# Patient Record
Sex: Female | Born: 1975 | Race: White | Hispanic: No | Marital: Single | Smoking: Current every day smoker
Health system: Southern US, Community
[De-identification: ages and names within clinical notes are randomized; demographics above are authoritative.]

## PROBLEM LIST (undated history)

## (undated) DIAGNOSIS — N83209 Unspecified ovarian cyst, unspecified side: Secondary | ICD-10-CM

## (undated) DIAGNOSIS — D649 Anemia, unspecified: Secondary | ICD-10-CM

## (undated) DIAGNOSIS — N3281 Overactive bladder: Secondary | ICD-10-CM

## (undated) DIAGNOSIS — F32A Depression, unspecified: Secondary | ICD-10-CM

## (undated) DIAGNOSIS — F419 Anxiety disorder, unspecified: Secondary | ICD-10-CM

## (undated) DIAGNOSIS — F319 Bipolar disorder, unspecified: Secondary | ICD-10-CM

## (undated) DIAGNOSIS — F4310 Post-traumatic stress disorder, unspecified: Secondary | ICD-10-CM

## (undated) DIAGNOSIS — F603 Borderline personality disorder: Secondary | ICD-10-CM

## (undated) DIAGNOSIS — Z0289 Encounter for other administrative examinations: Secondary | ICD-10-CM

## (undated) DIAGNOSIS — R42 Dizziness and giddiness: Secondary | ICD-10-CM

## (undated) HISTORY — DX: Anemia, unspecified: D64.9

## (undated) HISTORY — DX: Overactive bladder: N32.81

## (undated) HISTORY — DX: Bipolar disorder, unspecified: F31.9

## (undated) HISTORY — DX: Borderline personality disorder: F60.3

## (undated) HISTORY — DX: Unspecified ovarian cyst, unspecified side: N83.209

## (undated) HISTORY — PX: TUBAL LIGATION: SHX77

## (undated) HISTORY — DX: Post-traumatic stress disorder, unspecified: F43.10

## (undated) HISTORY — DX: Depression, unspecified: F32.A

## (undated) HISTORY — DX: Anxiety disorder, unspecified: F41.9

---
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
## 1997-10-07 ENCOUNTER — Ambulatory Visit (INDEPENDENT_AMBULATORY_CARE_PROVIDER_SITE_OTHER): Admit: 1997-10-07 | Disposition: A | Discharge status: Home / Self Care | Payer: Self-pay | Source: Ambulatory Visit

## 1997-10-19 ENCOUNTER — Ambulatory Visit (INDEPENDENT_AMBULATORY_CARE_PROVIDER_SITE_OTHER): Admit: 1997-10-19 | Disposition: A | Discharge status: Home / Self Care | Payer: Self-pay | Source: Ambulatory Visit

## 1997-11-25 ENCOUNTER — Emergency Department: Admit: 1997-11-25 | Disposition: A | Discharge status: Home / Self Care | Payer: Self-pay | Source: Ambulatory Visit

## 1997-11-26 ENCOUNTER — Emergency Department: Admit: 1997-11-26 | Disposition: A | Discharge status: Home / Self Care | Payer: Self-pay | Source: Ambulatory Visit

## 1997-12-17 ENCOUNTER — Emergency Department: Admit: 1997-12-17 | Disposition: A | Discharge status: Home / Self Care | Payer: Self-pay | Source: Ambulatory Visit

## 1997-12-24 ENCOUNTER — Ambulatory Visit: Admit: 1997-12-24 | Disposition: A | Discharge status: Home / Self Care | Payer: Self-pay | Source: Ambulatory Visit

## 1997-12-26 ENCOUNTER — Ambulatory Visit
Admission: EM | Admit: 1997-12-26 | Disposition: A | Discharge status: Home / Self Care | Payer: Self-pay | Source: Ambulatory Visit

## 1998-02-02 ENCOUNTER — Emergency Department: Admit: 1998-02-02 | Disposition: A | Discharge status: Home / Self Care | Payer: Self-pay | Source: Ambulatory Visit

## 1998-02-15 ENCOUNTER — Ambulatory Visit
Admission: AD | Admit: 1998-02-15 | Disposition: A | Discharge status: Home / Self Care | Payer: Self-pay | Source: Ambulatory Visit

## 1998-02-23 ENCOUNTER — Emergency Department: Admit: 1998-02-23 | Disposition: A | Discharge status: Home / Self Care | Payer: Self-pay | Source: Ambulatory Visit

## 1998-03-04 ENCOUNTER — Ambulatory Visit: Admission: EM | Admit: 1998-03-04 | Disposition: A | Discharge status: Home / Self Care | Payer: Self-pay

## 1998-03-05 ENCOUNTER — Emergency Department
Admission: EM | Admit: 1998-03-05 | Disposition: A | Discharge status: Home / Self Care | Payer: Self-pay | Source: Ambulatory Visit

## 1998-03-06 ENCOUNTER — Emergency Department
Admission: EM | Admit: 1998-03-06 | Disposition: A | Discharge status: Home / Self Care | Payer: Self-pay | Source: Ambulatory Visit

## 1998-03-09 ENCOUNTER — Emergency Department: Admit: 1998-03-09 | Disposition: A | Discharge status: Home / Self Care | Payer: Self-pay | Source: Ambulatory Visit

## 1998-03-10 ENCOUNTER — Emergency Department: Admit: 1998-03-10 | Disposition: A | Discharge status: Home / Self Care | Payer: Self-pay | Source: Ambulatory Visit

## 1998-03-14 ENCOUNTER — Emergency Department: Admit: 1998-03-14 | Disposition: A | Discharge status: Home / Self Care | Payer: Self-pay | Source: Ambulatory Visit

## 1998-03-16 ENCOUNTER — Emergency Department: Admit: 1998-03-16 | Disposition: A | Discharge status: Home / Self Care | Payer: Self-pay | Source: Ambulatory Visit

## 1998-03-17 ENCOUNTER — Emergency Department
Admission: EM | Admit: 1998-03-17 | Disposition: A | Discharge status: Home / Self Care | Payer: Self-pay | Source: Ambulatory Visit

## 1998-03-22 ENCOUNTER — Emergency Department: Admit: 1998-03-22 | Disposition: A | Discharge status: Home / Self Care | Payer: Self-pay | Source: Ambulatory Visit

## 1998-03-23 ENCOUNTER — Ambulatory Visit
Admission: AD | Admit: 1998-03-23 | Discharge status: Against Medical Advice | Payer: Self-pay | Source: Ambulatory Visit

## 1998-04-01 ENCOUNTER — Emergency Department: Admit: 1998-04-01 | Disposition: A | Discharge status: Home / Self Care | Payer: Self-pay | Source: Ambulatory Visit

## 1998-04-05 ENCOUNTER — Emergency Department: Admit: 1998-04-05 | Disposition: A | Discharge status: Home / Self Care | Payer: Self-pay | Source: Ambulatory Visit

## 1998-04-06 ENCOUNTER — Emergency Department: Admit: 1998-04-06 | Disposition: A | Discharge status: Home / Self Care | Payer: Self-pay | Source: Ambulatory Visit

## 1998-04-11 ENCOUNTER — Ambulatory Visit (INDEPENDENT_AMBULATORY_CARE_PROVIDER_SITE_OTHER): Admit: 1998-04-11 | Disposition: A | Discharge status: Home / Self Care | Payer: Self-pay | Source: Ambulatory Visit

## 1998-04-22 ENCOUNTER — Emergency Department: Admit: 1998-04-22 | Disposition: A | Discharge status: Home / Self Care | Payer: Self-pay | Source: Ambulatory Visit

## 1998-04-27 ENCOUNTER — Emergency Department: Admit: 1998-04-27 | Disposition: A | Discharge status: Home / Self Care | Payer: Self-pay | Source: Ambulatory Visit

## 1998-05-02 ENCOUNTER — Emergency Department: Admit: 1998-05-02 | Disposition: A | Discharge status: Home / Self Care | Payer: Self-pay | Source: Ambulatory Visit

## 1998-05-06 ENCOUNTER — Emergency Department: Admit: 1998-05-06 | Disposition: A | Discharge status: Home / Self Care | Payer: Self-pay | Source: Ambulatory Visit

## 1998-05-26 ENCOUNTER — Emergency Department: Admit: 1998-05-26 | Disposition: A | Discharge status: Home / Self Care | Payer: Self-pay | Source: Ambulatory Visit

## 1998-05-28 ENCOUNTER — Emergency Department: Admit: 1998-05-28 | Disposition: A | Discharge status: Home / Self Care | Payer: Self-pay | Source: Ambulatory Visit

## 1998-06-02 ENCOUNTER — Emergency Department: Admit: 1998-06-02 | Disposition: A | Discharge status: Home / Self Care | Payer: Self-pay | Source: Ambulatory Visit

## 1998-06-23 ENCOUNTER — Ambulatory Visit: Admit: 1998-06-23 | Disposition: A | Discharge status: Home / Self Care | Payer: Self-pay | Source: Ambulatory Visit

## 1998-06-26 ENCOUNTER — Ambulatory Visit: Admit: 1998-06-26 | Disposition: A | Discharge status: Home / Self Care | Payer: Self-pay | Source: Ambulatory Visit

## 1998-06-28 ENCOUNTER — Emergency Department: Admit: 1998-06-28 | Disposition: A | Discharge status: Home / Self Care | Payer: Self-pay | Source: Ambulatory Visit

## 1998-06-30 ENCOUNTER — Emergency Department: Admit: 1998-06-30 | Disposition: A | Discharge status: Home / Self Care | Payer: Self-pay | Source: Ambulatory Visit

## 1998-06-30 ENCOUNTER — Ambulatory Visit
Admission: AD | Admit: 1998-06-30 | Disposition: A | Discharge status: Other Acute Inpatient Hospital | Payer: Self-pay | Source: Ambulatory Visit

## 2002-08-01 ENCOUNTER — Emergency Department
Admission: EM | Admit: 2002-08-01 | Disposition: A | Discharge status: Home / Self Care | Payer: Self-pay | Source: Ambulatory Visit

## 2004-04-25 ENCOUNTER — Emergency Department
Admission: EM | Admit: 2004-04-25 | Disposition: A | Discharge status: Home / Self Care | Payer: Self-pay | Source: Ambulatory Visit

## 2004-04-28 ENCOUNTER — Ambulatory Visit
Admission: EM | Admit: 2004-04-28 | Disposition: A | Discharge status: Home / Self Care | Payer: Self-pay | Source: Ambulatory Visit

## 2004-05-14 ENCOUNTER — Emergency Department
Admission: EM | Admit: 2004-05-14 | Disposition: A | Discharge status: Home / Self Care | Payer: Self-pay | Source: Ambulatory Visit

## 2004-05-26 ENCOUNTER — Emergency Department
Admission: EM | Admit: 2004-05-26 | Disposition: A | Discharge status: Home / Self Care | Payer: Self-pay | Source: Ambulatory Visit

## 2004-05-28 ENCOUNTER — Emergency Department
Admission: EM | Admit: 2004-05-28 | Disposition: A | Discharge status: Home / Self Care | Payer: Self-pay | Source: Ambulatory Visit

## 2004-05-31 ENCOUNTER — Emergency Department
Admission: EM | Admit: 2004-05-31 | Disposition: A | Discharge status: Home / Self Care | Payer: Self-pay | Source: Ambulatory Visit

## 2004-06-19 ENCOUNTER — Emergency Department
Admission: EM | Admit: 2004-06-19 | Disposition: A | Discharge status: Home / Self Care | Payer: Self-pay | Source: Ambulatory Visit

## 2004-06-20 ENCOUNTER — Emergency Department
Admission: EM | Admit: 2004-06-20 | Disposition: A | Discharge status: Home / Self Care | Payer: Self-pay | Source: Ambulatory Visit

## 2004-07-30 ENCOUNTER — Emergency Department
Admission: EM | Admit: 2004-07-30 | Disposition: A | Discharge status: Home / Self Care | Payer: Self-pay | Source: Ambulatory Visit

## 2005-05-18 ENCOUNTER — Emergency Department
Admission: EM | Admit: 2005-05-18 | Disposition: A | Discharge status: Home / Self Care | Payer: Self-pay | Source: Ambulatory Visit

## 2005-05-21 ENCOUNTER — Emergency Department
Admission: EM | Admit: 2005-05-21 | Disposition: A | Discharge status: Home / Self Care | Payer: Self-pay | Source: Ambulatory Visit

## 2005-05-27 ENCOUNTER — Emergency Department
Admission: EM | Admit: 2005-05-27 | Disposition: A | Discharge status: Home / Self Care | Payer: Self-pay | Source: Ambulatory Visit

## 2005-05-29 ENCOUNTER — Emergency Department
Admission: EM | Admit: 2005-05-29 | Disposition: A | Discharge status: Home / Self Care | Payer: Self-pay | Source: Ambulatory Visit

## 2005-06-02 ENCOUNTER — Emergency Department
Admission: EM | Admit: 2005-06-02 | Disposition: A | Discharge status: Home / Self Care | Payer: Self-pay | Source: Ambulatory Visit

## 2006-04-09 ENCOUNTER — Emergency Department
Admission: EM | Admit: 2006-04-09 | Disposition: A | Discharge status: Home / Self Care | Payer: Self-pay | Source: Ambulatory Visit

## 2006-04-12 ENCOUNTER — Emergency Department
Admission: EM | Admit: 2006-04-12 | Disposition: A | Discharge status: Home / Self Care | Payer: Self-pay | Source: Ambulatory Visit

## 2006-04-14 ENCOUNTER — Ambulatory Visit
Admission: EM | Admit: 2006-04-14 | Disposition: A | Discharge status: Home / Self Care | Payer: Self-pay | Source: Ambulatory Visit

## 2006-04-21 ENCOUNTER — Ambulatory Visit
Admission: RE | Admit: 2006-04-21 | Disposition: A | Discharge status: Home / Self Care | Payer: Self-pay | Source: Ambulatory Visit

## 2006-10-22 ENCOUNTER — Emergency Department
Admission: EM | Admit: 2006-10-22 | Disposition: A | Discharge status: Home / Self Care | Payer: Self-pay | Source: Ambulatory Visit

## 2013-09-06 ENCOUNTER — Emergency Department
Admission: EM | Admit: 2013-09-06 | Discharge: 2013-09-06 | Disposition: A | Discharge status: Home / Self Care | Payer: Medicaid Other | Attending: Emergency Medicine | Admitting: Emergency Medicine

## 2013-09-06 ENCOUNTER — Emergency Department: Payer: Medicaid Other

## 2013-09-06 DIAGNOSIS — N83209 Unspecified ovarian cyst, unspecified side: Secondary | ICD-10-CM | POA: Insufficient documentation

## 2013-09-06 DIAGNOSIS — F319 Bipolar disorder, unspecified: Secondary | ICD-10-CM | POA: Insufficient documentation

## 2013-09-06 DIAGNOSIS — F411 Generalized anxiety disorder: Secondary | ICD-10-CM | POA: Insufficient documentation

## 2013-09-06 DIAGNOSIS — F4310 Post-traumatic stress disorder, unspecified: Secondary | ICD-10-CM | POA: Insufficient documentation

## 2013-09-06 DIAGNOSIS — F17200 Nicotine dependence, unspecified, uncomplicated: Secondary | ICD-10-CM | POA: Insufficient documentation

## 2013-09-06 DIAGNOSIS — N318 Other neuromuscular dysfunction of bladder: Secondary | ICD-10-CM | POA: Insufficient documentation

## 2013-09-06 DIAGNOSIS — F603 Borderline personality disorder: Secondary | ICD-10-CM | POA: Insufficient documentation

## 2013-09-06 DIAGNOSIS — F3289 Other specified depressive episodes: Secondary | ICD-10-CM | POA: Insufficient documentation

## 2013-09-06 DIAGNOSIS — D649 Anemia, unspecified: Secondary | ICD-10-CM | POA: Insufficient documentation

## 2013-09-06 NOTE — ED Notes (Signed)
Megan Pratt crisis care to attempt to call pts mother, to report back.

## 2013-09-06 NOTE — ED Notes (Signed)
Crisis care at bedside now

## 2013-09-06 NOTE — ED Notes (Signed)
Pt given food and sprite, pt denies other needs. Given cell phone to call pastor.

## 2013-09-06 NOTE — ED Notes (Signed)
md shanabrook at bedside. Pt medical c/o cramping in legs and continued over active bladder. Pt denies other medical complaints at this time. Pt clearly anxious, pt cooperative, but prefers to stand and watch RN type during assessment. Pt reports has a court case involving her kids Monday. No other needs expressed. Call bell within reach

## 2013-09-06 NOTE — ED Provider Notes (Signed)
Physician/Midlevel provider first contact with patient: 09/06/13 1927         History     Chief Complaint   Patient presents with   . Psychiatric Evaluation     Patient is a 37 y.o. female presenting with mental health disorder. The history is provided by the patient.   Mental Health Problem  Primary symptoms comment: patient presents to the emergency department after asking the police to bring her here so that I could establish her physical and mental health.. She recently had an altercation with her mother  regarding her mental health The current episode started today (she notes that her mother was called her "psycho").   The onset of the illness is precipitated by a stressful event. The degree of incapacity that she is experiencing as a consequence of her illness is mild. Additional symptoms of the illness do not include no anhedonia, no insomnia, no hypersomnia, no appetite change, no unexpected weight change, no fatigue, no agitation, no psychomotor retardation, no feelings of worthlessness, no attention impairment, no euphoric mood, no increased goal-directed activity, no flight of ideas, no inflated self-esteem, no decreased need for sleep, not distractible, no poor judgment, no visual change, no headaches, no abdominal pain or no seizures. She does not admit to suicidal ideas. She does not have a plan to commit suicide. She does not contemplate harming herself. She has not already injured self. She does not contemplate injuring another person. She has not already  injured another person. Risk factors that are present for mental illness include a history of mental illness.       Past Medical History   Diagnosis Date   . Bipolar 1 disorder    . Depression    . Anxiety    . Borderline personality disorder    . PTSD (post-traumatic stress disorder)    . Ovarian cyst    . Anemia    . Overactive bladder        Past Surgical History   Procedure Date   . Tubal ligation        History reviewed. No pertinent family  history.    Social  History   Substance Use Topics   . Smoking status: Current Every Day Smoker -- 0.5 packs/day     Types: Cigarettes   . Smokeless tobacco: Never Used   . Alcohol Use: No       .     Allergies   Allergen Reactions   . Penicillins        Current/Home Medications    CALCIUM CARBONATE (TUMS) 500 MG CHEWABLE TABLET    Chew 1 tablet by mouth daily.    HYDROXYZINE (ATARAX) 50 MG TABLET    Take 50 mg by mouth nightly.    OXYBUTYNIN (DITROPAN) 5 MG TABLET    Take 5 mg by mouth 2 (two) times daily. TAKE TWO TABLETS BID    PHENAZOPYRIDINE (PYRIDIUM) 200 MG TABLET    Take 200 mg by mouth 3 (three) times daily as needed.    RISPERIDONE (RISPERDAL) 0.5 MG TABLET    Take 0.5 mg by mouth 2 (two) times daily.        Review of Systems   Constitutional: Negative for fever, appetite change, fatigue and unexpected weight change.   HENT: Negative for congestion, ear pain, rhinorrhea and sore throat.    Eyes: Negative for discharge and redness.   Respiratory: Negative for cough.    Cardiovascular: Negative for chest pain and leg swelling.  Gastrointestinal: Negative for nausea, vomiting, abdominal pain, diarrhea, constipation and blood in stool.   Genitourinary: Negative for dysuria, frequency, hematuria, flank pain, vaginal bleeding, vaginal discharge and menstrual problem.   Musculoskeletal: Negative for arthralgias.   Skin: Negative for rash.   Neurological: Negative for seizures and headaches.   Hematological: Negative for adenopathy. Does not bruise/bleed easily.   Psychiatric/Behavioral: Negative for suicidal ideas and agitation. The patient does not have insomnia.        Physical Exam    BP 141/94  Pulse 97  Temp 98.6 F (37 C)  Resp 18  Ht 1.676 m  Wt 102.7 kg  BMI 36.56 kg/m2  SpO2 98%  LMP 08/28/2013    Physical Exam   Nursing note and vitals reviewed.  Constitutional: She is oriented to person, place, and time. She appears well-developed and well-nourished. No distress.   HENT:   Head: Normocephalic  and atraumatic.   Right Ear: External ear normal.   Left Ear: External ear normal.   Nose: Nose normal.   Mouth/Throat: Oropharynx is clear and moist. No oropharyngeal exudate.   Eyes: Conjunctivae normal and EOM are normal. Pupils are equal, round, and reactive to light. Right eye exhibits no discharge. Left eye exhibits no discharge. Right conjunctiva is not injected. No scleral icterus.   Neck: Normal range of motion. Neck supple. No tracheal deviation present. No mass and no thyromegaly present.   Cardiovascular: Normal rate, regular rhythm, normal heart sounds and intact distal pulses.  Exam reveals no gallop and no friction rub.    No murmur heard.  Pulmonary/Chest: Breath sounds normal. No accessory muscle usage or stridor. No respiratory distress. She has no wheezes. She has no rales.   Abdominal: Soft. Bowel sounds are normal. She exhibits no distension and no mass. There is no hepatosplenomegaly. There is no tenderness. There is no rebound and no guarding.   Musculoskeletal: Normal range of motion. She exhibits no edema and no tenderness.   Neurological: She is alert and oriented to person, place, and time. No cranial nerve deficit. She exhibits normal muscle tone. Coordination normal.   Skin: Skin is warm and dry. No rash noted. She is not diaphoretic. No erythema. No pallor.   Psychiatric: She has a normal mood and affect. Her behavior is normal.       MDM and ED Course     ED Medication Orders     None           MDM  Number of Diagnoses or Management Options  Acute anxiety: new and requires workup  Bipolar disorder: established and improving  Diagnosis management comments: This patient presented with a psychiatric condition.  The patient was evaluated and their psychiatric illness has been determined by the ED MD along with appropriate ancillary mental health services, to be stable for outpatient management.  Differential diagnosis included but was not limited to depression, schizophrenia, bipolar  disorder, psychosis, and suicidal or homicidal risk. The patient currently does not exhibit active suicidal or homicidal ideation and is felt to be at low risk for a suicide attempt.  The patient is not gravely disabled and has the means to obtain food and shelter.  Referrals to outpatient mental health resources have been provided prior to discharge and the patient has also been instructed to return immediately if any acute worsening their mental health occurs.  They have also been counseled to abstain from alcohol or drugs.  Diagnostic impression and plan were discussed with the patient and/or  family.  Results of lab/radiology tests were reviewed and discussed with the patient and/or family. All questions were answered and concerns addressed.        Amount and/or Complexity of Data Reviewed  Decide to obtain previous medical records or to obtain history from someone other than the patient: yes  Discuss the patient with other providers: yes    Risk of Complications, Morbidity, and/or Mortality  Presenting problems: moderate  Diagnostic procedures: minimal  Management options: moderate  General comments: Patient stable in ED.  No signs of homicidal, suicidal intent/ideation.    Patient Progress  Patient progress: improved        Procedures    Clinical Impression & Disposition     Clinical Impression  Final diagnoses:   Bipolar disorder   Acute anxiety        ED Disposition     Discharge [name] discharge to home/self care.    Condition at disposition: Stable             New Prescriptions    No medications on file               Fabian Sharp, MD  09/13/13 [phone]

## 2013-09-06 NOTE — ED Notes (Signed)
Pt awaiting crisis care now

## 2013-09-06 NOTE — ED Notes (Signed)
Pt reports has had children taken from her, possibly for psych reasons, pt reports is in [place] visiting her mother. Got in an argument with her mother tonight. Pt reports her mother "called her psycho" and she "Wants someone to tell them that shes not crazy". "I want to prove my innocence"

## 2013-09-28 ENCOUNTER — Emergency Department
Admission: EM | Admit: 2013-09-28 | Discharge: 2013-09-28 | Disposition: A | Discharge status: Home / Self Care | Payer: Medicaid Other | Attending: Emergency Medicine | Admitting: Emergency Medicine

## 2013-09-28 ENCOUNTER — Emergency Department: Payer: Medicaid Other

## 2013-09-28 DIAGNOSIS — R5383 Other fatigue: Secondary | ICD-10-CM | POA: Insufficient documentation

## 2013-09-28 DIAGNOSIS — F3289 Other specified depressive episodes: Secondary | ICD-10-CM | POA: Insufficient documentation

## 2013-09-28 DIAGNOSIS — R5381 Other malaise: Secondary | ICD-10-CM | POA: Insufficient documentation

## 2013-09-28 DIAGNOSIS — F4320 Adjustment disorder, unspecified: Secondary | ICD-10-CM | POA: Insufficient documentation

## 2013-09-28 DIAGNOSIS — F319 Bipolar disorder, unspecified: Secondary | ICD-10-CM | POA: Insufficient documentation

## 2013-09-28 LAB — VH URINE DRUG SCREEN
Amphetamine: NEGATIVE
Barbiturates: NEGATIVE
Cannabinoids: NEGATIVE
Cocaine: NEGATIVE
Opiates: NEGATIVE
Phencyclidine: NEGATIVE
Urine Benzodiazepines: NEGATIVE
Urine Creatinine Random: 70.96 mg/dL
Urine Methadone Screen: NEGATIVE
Urine Oxycodone: NEGATIVE
Urine Specific Gravity: 1.012 (ref 1.001–1.040)
pH, Urine: 5.4 pH (ref 5.0–8.0)

## 2013-09-28 LAB — BASIC METABOLIC PANEL
Anion Gap: 11.3 mMol/L (ref 7.0–18.0)
BUN / Creatinine Ratio: 9.5 Ratio — ABNORMAL LOW (ref 10.0–30.0)
BUN: 7 mg/dL (ref 7–22)
CO2: 24.6 mMol/L (ref 20.0–30.0)
Calcium: 10.3 mg/dL (ref 8.5–10.5)
Chloride: 107 mMol/L (ref 98–110)
Creatinine: 0.74 mg/dL (ref 0.60–1.20)
EGFR: >60 mL/min/{1.73_m2}
Glucose: 80 mg/dL (ref 70–99)
Osmolality Calc: 274 mOsm/kg — ABNORMAL LOW (ref 275–300)
Potassium: 3.9 mMol/L (ref 3.5–5.3)
Sodium: 139 mMol/L (ref 136–147)

## 2013-09-28 LAB — ETHANOL: Alcohol: <10 mg/dL (ref 0–9)

## 2013-09-28 LAB — CBC AND DIFFERENTIAL
Basophils %: 1 % (ref 0.0–3.0)
Basophils Absolute: 0.1 10*3/uL (ref 0.0–0.3)
Eosinophils %: 0.4 % (ref 0.0–7.0)
Eosinophils Absolute: 0 10*3/uL (ref 0.0–0.8)
Hematocrit: 35.1 % — ABNORMAL LOW (ref 36.0–48.0)
Hemoglobin: 11.1 gm/dL — ABNORMAL LOW (ref 12.0–16.0)
Lymphocytes Absolute: 2.1 10*3/uL (ref 0.6–5.1)
Lymphocytes: 36.9 % (ref 15.0–46.0)
MCH: 24 pg — ABNORMAL LOW (ref 28–35)
MCHC: 32 gm/dL (ref 32–36)
MCV: 78 fL — ABNORMAL LOW (ref 80–100)
MPV: 12.1 fL — ABNORMAL HIGH (ref 6.0–10.0)
Monocytes Absolute: 0.3 10*3/uL (ref 0.1–1.7)
Monocytes: 4.8 % (ref 3.0–15.0)
Neutrophils %: 56.9 % (ref 42.0–78.0)
Neutrophils Absolute: 3.2 10*3/uL (ref 1.7–8.6)
PLT CT: 199 10*3/uL (ref 130–440)
RBC: 4.52 10*6/uL (ref 3.80–5.00)
RDW: 15.8 % — ABNORMAL HIGH (ref 11.0–14.0)
WBC: 5.7 10*3/uL (ref 4.0–11.0)

## 2013-09-28 LAB — SALICYLATE LEVEL: Salicylate Level: <5 mg/dL — ABNORMAL LOW (ref 5.0–30.0)

## 2013-09-28 LAB — TSH: TSH: 1.04 u[IU]/mL (ref 0.40–4.20)

## 2013-09-28 LAB — HCG, SERUM, QUALITATIVE: BHCG Qualitative: NEGATIVE

## 2013-09-28 LAB — ACETAMINOPHEN LEVEL: Acetaminophen Level: <3 ug/mL — ABNORMAL LOW (ref 10.0–30.0)

## 2013-09-28 MED ORDER — NICOTINE 21 MG/24HR TD PT24
1.0000 | MEDICATED_PATCH | Freq: Once | TRANSDERMAL | Status: DC
Start: 2013-09-28 — End: 2013-09-28

## 2013-09-28 NOTE — ED Notes (Signed)
Crises called.

## 2013-09-28 NOTE — ED Notes (Signed)
Straight stick to right wrist first attempt, pt tolerated well.  Urine and blood sent to lab at this time.

## 2013-09-28 NOTE — ED Notes (Signed)
Pt seen for psychiatric assessment.  Pt denies suicidal and homicidal ideations.  Psychosis not evident.  She denies need for hospitalization.  D/C safety plan done and referrals for outpatient treatment given.

## 2013-09-28 NOTE — ED Provider Notes (Addendum)
Physician/Midlevel provider first contact with patient: 09/28/13 2001         History     Chief Complaint   Patient presents with   . Psychiatric Evaluation     HPI Comments: The patient comes in today after her she called 911 wanting to speak with the crisis worker. She says the police came to her house and brought her here.  Patient says that she has been stressed lately due to to her strained relationship with her mother; she says that she recently moved in with her. Her mother apparently does not approve of her smoking habits and the patient says that her mother "torments me"  by what holding cigarettes. The patient has a history of depression; medically, the patient says that she has been anemic "all my life." the patient also says that she has "female problems" involving an irregular menstrual period. The patient denies feeling suicidal or wanting to hurt herself.     The history is provided by the patient.       Past Medical History   Diagnosis Date   . Bipolar 1 disorder    . Depression    . Anxiety    . Borderline personality disorder    . PTSD (post-traumatic stress disorder)    . Ovarian cyst    . Anemia    . Overactive bladder        Past Surgical History   Procedure Date   . Tubal ligation        History reviewed. No pertinent family history.    Social  History   Substance Use Topics   . Smoking status: Current Every Day Smoker -- 0.5 packs/day     Types: Cigarettes   . Smokeless tobacco: Never Used   . Alcohol Use: No       .     Allergies   Allergen Reactions   . Penicillins        Current/Home Medications    CALCIUM CARBONATE (TUMS) 500 MG CHEWABLE TABLET    Chew 1 tablet by mouth daily.    HYDROXYZINE (ATARAX) 50 MG TABLET    Take 50 mg by mouth nightly.    OXYBUTYNIN (DITROPAN) 5 MG TABLET    Take 5 mg by mouth 2 (two) times daily. TAKE TWO TABLETS BID    PHENAZOPYRIDINE (PYRIDIUM) 200 MG TABLET    Take 200 mg by mouth 3 (three) times daily as needed.    RISPERIDONE (RISPERDAL) 0.5 MG TABLET     Take 0.5 mg by mouth 2 (two) times daily.        Review of Systems   Constitutional: Positive for fatigue.        Patient says that she has been more fatigued and irritable than usual   Psychiatric/Behavioral: Positive for dysphoric mood.       Physical Exam    BP 118/75  Pulse 80  Temp 98 F (36.7 C)  Resp 18  Wt 99.791 kg  SpO2 99%  LMP 08/28/2013    Physical Exam   Nursing note and vitals reviewed.  Constitutional: She appears well-developed and well-nourished.        General pallor   HENT:   Head: Normocephalic and atraumatic.   Mouth/Throat: Oropharynx is clear and moist. No oropharyngeal exudate.   Eyes: Conjunctivae normal and EOM are normal. Right eye exhibits no discharge. Left eye exhibits no discharge. No scleral icterus.   Neck: Neck supple. No tracheal deviation present. No thyromegaly present.  Cardiovascular: Normal rate and regular rhythm.  Exam reveals no gallop and no friction rub.    No murmur heard.  Pulmonary/Chest: Effort normal and breath sounds normal. No stridor. No respiratory distress. She has no wheezes. She has no rales. She exhibits no tenderness.   Abdominal: Bowel sounds are normal. She exhibits no distension and no mass. There is no tenderness. There is no rebound and no guarding.   Musculoskeletal: She exhibits no edema and no tenderness.   Lymphadenopathy:     She has no cervical adenopathy.   Neurological: She is alert. She exhibits normal muscle tone. Coordination normal.   Skin: No rash noted. She is not diaphoretic. No erythema. There is pallor.   Psychiatric: She exhibits a depressed mood.        Hyper-religious overtones when patient talks       MDM and ED Course     ED Medication Orders      Start     Status Ordering Provider    09/28/13 2010   nicotine (NICODERM CQ) 21 MG/24HR patch 1 patch   Once in ED      Route: Transdermal  Ordered Dose: 1 patch         Ordered Oseias Horsey C                 MDM  Number of Diagnoses or Management Options  Bipolar disorder:  established and worsening  Diagnosis management comments: CONSULT  09/28/2013 8:11 PM: Placed call to crisis care.  Call returned:  2011  Physician coming to see patient    1. Depression  2. Bipolar disorder  3. Stressful social situation             Procedures    Clinical Impression & Disposition     Clinical Impression  Final diagnoses:   Bipolar disorder        ED Disposition     Discharge [name] discharge to home/self care.    Condition at disposition: Stable             New Prescriptions    No medications on file               Myna Hidalgo, MD  09/28/13 2038    Myna Hidalgo, MD  09/28/13 2123

## 2013-09-28 NOTE — ED Notes (Signed)
Pt states that she wants to talk to crisis worker

## 2013-09-28 NOTE — ED Notes (Signed)
Dr Jennye Boroughs at bedside.

## 2013-09-28 NOTE — ED Notes (Signed)
Crisis at bedside.

## 2013-09-29 ENCOUNTER — Emergency Department: Payer: Medicaid Other

## 2013-09-29 ENCOUNTER — Emergency Department
Admission: EM | Admit: 2013-09-29 | Discharge: 2013-09-29 | Disposition: A | Discharge status: Home / Self Care | Payer: Medicaid Other | Attending: Emergency Medicine | Admitting: Emergency Medicine

## 2013-09-29 DIAGNOSIS — Z5900 Homelessness unspecified: Secondary | ICD-10-CM | POA: Insufficient documentation

## 2013-09-29 DIAGNOSIS — D649 Anemia, unspecified: Secondary | ICD-10-CM | POA: Insufficient documentation

## 2013-09-29 DIAGNOSIS — F411 Generalized anxiety disorder: Secondary | ICD-10-CM | POA: Insufficient documentation

## 2013-09-29 DIAGNOSIS — Z87891 Personal history of nicotine dependence: Secondary | ICD-10-CM | POA: Insufficient documentation

## 2013-09-29 DIAGNOSIS — F319 Bipolar disorder, unspecified: Secondary | ICD-10-CM | POA: Insufficient documentation

## 2013-09-29 MED ORDER — NICOTINE 21 MG/24HR TD PT24
1.0000 | MEDICATED_PATCH | Freq: Once | TRANSDERMAL | Status: DC
Start: 2013-09-29 — End: 2013-09-29

## 2013-09-29 NOTE — ED Provider Notes (Signed)
Physician/Midlevel provider first contact with patient: 09/29/13 1212         [facility] EMERGENCY DEPARTMENT History and Physical Exam      Patient Name: Megan Pratt  Encounter Date:  09/29/2013  Attending Physician: Theodora Blow, MD  PCP: Christa See, MD  Patient DOB:  1976/04/07  MRN:  [id number]  Room:  V40/J81-X      History of Presenting Illness     Chief complaint: Urinary Frequency    HPI/ROS is limited by: mental disability  HPI/ROS given by: patient and and crisis care specialist    Location: n/a  Duration: today  Severity: unknown    Megan Pratt Hock is a 37 y.o. female who presents with a chief complaint of "I have nowhere to live."  The patient was seen in the emergency department by crisis care yesterday, signed a safety contract, and was discharged to home.  She states that when she arrived home last night her parents told her they were "going to have her institutionalized".  She states that this morning her mother told her that she can no longer live at home. The patient states, "I have no where to go.  I need a nice safe place to stay."  She has refused an APS referral in the past, but agrees to it today, after understanding it will help her to find lodging.    Review of Systems     Review of Systems   Constitutional: Negative.    HENT: Negative.    Eyes: Negative.    Respiratory: Negative.    Cardiovascular: Negative.    Gastrointestinal: Negative.    Genitourinary: Negative.    Musculoskeletal: Negative.    Skin: Negative.    Neurological: Negative.    Endo/Heme/Allergies: Negative.    Psychiatric/Behavioral: Negative for suicidal ideas and substance abuse. The patient is nervous/anxious.      Allergies     Pt is allergic to penicillins.    Medications     Current Outpatient Rx   Name  Route  Sig  Dispense  Refill   . CALCIUM CARBONATE ANTACID 500 MG PO CHEW    Oral    Chew 1 tablet by mouth daily.             Marland Kitchen HYDROXYZINE HCL 50 MG PO TABS    Oral    Take 50 mg by mouth nightly.              . OXYBUTYNIN CHLORIDE 5 MG PO TABS    Oral    Take 5 mg by mouth 2 (two) times daily. TAKE TWO TABLETS BID             . PHENAZOPYRIDINE HCL 200 MG PO TABS    Oral    Take 200 mg by mouth 3 (three) times daily as needed.             Marland Kitchen RISPERIDONE 0.5 MG PO TABS    Oral    Take 0.5 mg by mouth 2 (two) times daily.                  Past Medical History     Pt has a past medical history of Bipolar 1 disorder; Depression; Anxiety; Borderline personality disorder; PTSD (post-traumatic stress disorder); Ovarian cyst; Anemia; and Overactive bladder.    Past Surgical History     Pt has past surgical history that includes Tubal ligation.    Family History     The family history is  not on file.    Social History     Pt reports that she has been smoking Cigarettes.  She has been smoking about .5 packs per day. She has never used smokeless tobacco. She reports that she does not drink alcohol or use illicit drugs.    Physical Exam     Blood pressure 132/89, pulse 65, temperature 97.7 F (36.5 C), resp. rate 18, height 1.676 m, weight 100.8 kg, last menstrual period 08/28/2013, SpO2 100.00%.    Physical Exam   Constitutional: She is oriented to person, place, and time. She appears well-developed and well-nourished. No distress.   HENT:   Head: Normocephalic and atraumatic.   Mouth/Throat: Oropharynx is clear and moist.   Eyes: Conjunctivae normal and EOM are normal. Pupils are equal, round, and reactive to light. Right eye exhibits no discharge. Left eye exhibits no discharge. No scleral icterus.   Neck: Normal range of motion. Neck supple. No JVD present.   Cardiovascular: Normal rate, regular rhythm, normal heart sounds and intact distal pulses.    No murmur heard.  Pulmonary/Chest: Effort normal and breath sounds normal.   Abdominal: Soft. Bowel sounds are normal. She exhibits no distension. There is no tenderness.   Musculoskeletal: Normal range of motion. She exhibits no edema and no tenderness.   Lymphadenopathy:     She  has no cervical adenopathy.   Neurological: She is alert and oriented to person, place, and time. No cranial nerve deficit.   Skin: Skin is warm and dry. No rash noted.   Psychiatric: Her behavior is normal. Her mood appears anxious. Cognition and memory are impaired. She expresses no suicidal plans and no homicidal plans.        Religiously preoccupied, not hallucinating.     Orders Placed     No orders of the defined types were placed in this encounter.       Diagnostic Results       The results of the diagnostic studies below have been reviewed by myself:    Labs  Results     ** No Results found for the last 24 hours. **          Radiologic Studies  Radiology Results (24 Hour)     ** No Results found for the last 24 hours. **          EKG: none    ED Course & Treatment     Pt evaluated by [facility] again today and deemed suitable for d/c.  They will make an APS referral.  Pt advocate to help pt find a shelter tonight.     MDM / Critical Care     Blood pressure 132/89, pulse 65, temperature 97.7 F (36.5 C), resp. rate 18, height 1.676 m, weight 100.8 kg, last menstrual period 08/28/2013, SpO2 100.00%.        This chart was generated by an EMR and may contain errors or omissions not intended by the user.    Procedures     None    Diagnosis / Disposition     Clinical Impression  1. Homelessness        Disposition  ED Disposition     Discharge Megan L Suggs discharge to home/self care.    Condition at disposition: Stable            Prescriptions  New Prescriptions    No medications on file  Theodora Blow, MD  10/23/13 [phone]

## 2013-09-29 NOTE — ED Notes (Signed)
Spoke with Florentina Addison on Secretary/administrator for [employer]. States pt will need referred to a Shelter for the weekend and requests to be notified of her disposition for follow up on Monday to see what services she qualifies for.

## 2013-09-29 NOTE — ED Notes (Signed)
Pt was seen here last night has safety plan and denies SI or HI. Pt states she is not safe at parents house and has no where to go. Pt states she is not depressed

## 2013-09-29 NOTE — ED Notes (Signed)
Crisis care and Dr. Roseanna Rainbow in room evaluating pt

## 2017-01-22 ENCOUNTER — Encounter (HOSPITAL_COMMUNITY): Payer: Self-pay

## 2017-01-22 ENCOUNTER — Emergency Department (HOSPITAL_COMMUNITY)
Admission: EM | Admit: 2017-01-22 | Discharge: 2017-01-23 | Disposition: A | Discharge status: Home / Self Care | Payer: Medicaid - Out of State | Attending: Emergency Medicine | Admitting: Emergency Medicine

## 2017-01-22 ENCOUNTER — Emergency Department (HOSPITAL_COMMUNITY): Payer: Medicaid - Out of State

## 2017-01-22 DIAGNOSIS — Y9321 Activity, ice skating: Secondary | ICD-10-CM | POA: Insufficient documentation

## 2017-01-22 DIAGNOSIS — W01118A Fall on same level from slipping, tripping and stumbling with subsequent striking against other sharp object, initial encounter: Secondary | ICD-10-CM | POA: Insufficient documentation

## 2017-01-22 DIAGNOSIS — F17200 Nicotine dependence, unspecified, uncomplicated: Secondary | ICD-10-CM | POA: Diagnosis not present

## 2017-01-22 DIAGNOSIS — Y999 Unspecified external cause status: Secondary | ICD-10-CM | POA: Diagnosis not present

## 2017-01-22 DIAGNOSIS — Y929 Unspecified place or not applicable: Secondary | ICD-10-CM | POA: Diagnosis not present

## 2017-01-22 DIAGNOSIS — S060X0A Concussion without loss of consciousness, initial encounter: Secondary | ICD-10-CM | POA: Diagnosis not present

## 2017-01-22 DIAGNOSIS — S0990XA Unspecified injury of head, initial encounter: Secondary | ICD-10-CM | POA: Diagnosis present

## 2017-01-22 NOTE — ED Triage Notes (Signed)
Pt states ice skating, slipped and fell backwards. Struck head against wall. Pt denies any LOC. Pt with no obvious bleeding at triage. Pt with some expressive aphasia at triage.

## 2017-01-22 NOTE — ED Provider Notes (Signed)
[facility]-EMERGENCY DEPT Provider Note   CSN: [id number] Arrival date & time: 01/22/17  2243  By signing my name below, I, Elder NegusRussell Johnston, attest that this documentation has been prepared under the direction and in the presence of Gilda Creasehristopher J Pollina, MD. Electronically Signed: Elder Negusussell Johnston, Scribe. 01/23/17. 2:07 AM.   History   Chief Complaint Chief Complaint  Patient presents with  . Fall  . Head Injury    HPI Molly Jackson is a 41 y.o. female without any chronic medical problems who presents to the ED for evaluation following a fall. This patient states that she was ice skating approximately 2 hours ago when she slipped and fell backwards. Struck head on impact. At interview, she is amnesic to the exact circumstances following her injury. She is reporting mild posterior head pain and R lower back pain. No neck pain. She is also reporting some lightheadedness.   The history is provided by the patient. No language interpreter was used.  Fall  This is a new problem. The current episode started 1 to 2 hours ago. The problem has not changed since onset.Associated symptoms include headaches. Pertinent negatives include no chest pain and no abdominal pain.    History reviewed. No pertinent past medical history.  There are no active problems to display for this patient.   History reviewed. No pertinent surgical history.  OB History    No data available       Home Medications    Prior to Admission medications   Not on File    Family History History reviewed. No pertinent family history.  Social History Social History  Substance Use Topics  . Smoking status: Current Every Day Smoker  . Smokeless tobacco: Never Used  . Alcohol use No     Allergies   Penicillins   Review of Systems Review of Systems  Cardiovascular: Negative for chest pain.  Gastrointestinal: Negative for abdominal pain.  Musculoskeletal: Positive for back pain. Negative for neck  pain.  Neurological: Positive for light-headedness and headaches. Negative for syncope.  All other systems reviewed and are negative.    Physical Exam Updated Vital Signs BP (!) 139/98   Pulse 91   Temp 98.5 F (36.9 C) (Oral)   Resp 16   SpO2 97%   Physical Exam  Constitutional: She is oriented to person, place, and time. She appears well-developed and well-nourished. No distress.  HENT:  Head: Normocephalic.  Right Ear: Hearing normal.  Left Ear: Hearing normal.  Nose: Nose normal.  Mouth/Throat: Oropharynx is clear and moist and mucous membranes are normal.  There is a small occipital hematoma.   Eyes: Conjunctivae and EOM are normal. Pupils are equal, round, and reactive to light.  Neck: Normal range of motion. Neck supple.  Cardiovascular: Regular rhythm, S1 normal and S2 normal.  Exam reveals no gallop and no friction rub.   No murmur heard. Pulmonary/Chest: Effort normal and breath sounds normal. No respiratory distress. She exhibits no tenderness.  Abdominal: Soft. Normal appearance and bowel sounds are normal. There is no hepatosplenomegaly. There is no tenderness. There is no rebound, no guarding, no tenderness at McBurney's point and negative Murphy's sign. No hernia.  Musculoskeletal: Normal range of motion.  Neurological: She is alert and oriented to person, place, and time. She has normal strength. No cranial nerve deficit or sensory deficit. Coordination normal. GCS eye subscore is 4. GCS verbal subscore is 5. GCS motor subscore is 6.  Skin: Skin is warm, dry and intact. No rash  noted. No cyanosis.  Psychiatric: She has a normal mood and affect. Her speech is normal and behavior is normal. Thought content normal.  Nursing note and vitals reviewed.    ED Treatments / Results  Labs (all labs ordered are listed, but only abnormal results are displayed) Labs Reviewed - No data to display  EKG  EKG Interpretation None       Radiology Ct Head Wo  Contrast  Result Date: 01/23/2017 CLINICAL DATA:  Fall with posterior head pain. EXAM: CT HEAD WITHOUT CONTRAST TECHNIQUE: Contiguous axial images were obtained from the base of the skull through the vertex without intravenous contrast. COMPARISON:  None. FINDINGS: Brain: There is shunt tubing that travels through the anterior extra-axial space VA bifrontal approach. There are extra-axial calcifications along the right frontal convexity. There is no mass, intraparenchymal hemorrhage or extra-axial collection. The ventricles are mildly enlarged for age. No focal parenchymal abnormality. Vascular: No hyperdense vessel or unexpected calcification. Skull: No skull fracture Sinuses/Orbits: The visualized portions of the paranasal sinuses and mastoid air cells are free of fluid. No advanced mucosal thickening. The visualized orbits are normal. Other: None IMPRESSION: 1. No acute intracranial abnormality. 2. Shunt tubing travels through the anterior extra-axial space. Mildly enlarged ventricles for age, possibly due to underlying atrophy. Electronically Signed   By: Deatra Robinson M.D.   On: 01/23/2017 01:16    Procedures Procedures (including critical care time)  Medications Ordered in ED Medications - No data to display   Initial Impression / Assessment and Plan / ED Course  I have reviewed the triage vital signs and the nursing notes.  Pertinent labs & imaging results that were available during my care of the patient were reviewed by me and considered in my medical decision making (see chart for details).     Patient presents to the emergency department for evaluation of head injury. Patient reports that she slipped while skating and fell backwards, hitting the back of her head against a wall. There was no loss of consciousness, but patient was initially confused. She had no focal findings neurologically on examination. 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 CT scan performed. No evidence of injury. No significant hydrocephalus to suggest shunt failure. Patient does well here in the ER, she is awake, alert and oriented. No further confusion, sensorium has now cleared. Symptoms consistent with mild concussion without loss of consciousness. She is appropriate for discharge, given concussion precautions.  Final Clinical Impressions(s) / ED Diagnoses   Final diagnoses:  Concussion without loss of consciousness, initial encounter    New Prescriptions New Prescriptions   No medications on file  I personally performed the services described in this documentation, which was scribed in my presence. The recorded information has been reviewed and is accurate.    Gilda Crease, MD 01/23/17 [phone]

## 2017-01-23 NOTE — ED Notes (Signed)
The pt is c/o just feeling well  No pain

## 2017-01-23 NOTE — ED Notes (Signed)
Pt ambulated to bathroom x1 person assist. Pt slightly unsteady at first but progressively gains her balance the further she walks.

## 2017-01-23 NOTE — Discharge Instructions (Signed)
Schedule follow-up with one of the listed neurology groups if you have any persistent dizziness, memory loss, headaches.

## 2018-05-20 ENCOUNTER — Emergency Department
Admission: EM | Admit: 2018-05-20 | Discharge: 2018-05-21 | Discharge status: Against Medical Advice | Payer: 59 | Attending: Emergency Medicine | Admitting: Emergency Medicine

## 2018-05-20 DIAGNOSIS — Z5321 Procedure and treatment not carried out due to patient leaving prior to being seen by health care provider: Secondary | ICD-10-CM | POA: Insufficient documentation

## 2018-05-20 DIAGNOSIS — N939 Abnormal uterine and vaginal bleeding, unspecified: Secondary | ICD-10-CM | POA: Insufficient documentation

## 2018-05-21 LAB — CBC AND DIFFERENTIAL
Basophils %: 1.4 % (ref 0.0–3.0)
Basophils Absolute: 0.1 10*3/uL (ref 0.0–0.3)
Eosinophils %: 1.7 % (ref 0.0–7.0)
Eosinophils Absolute: 0.1 10*3/uL (ref 0.0–0.8)
Hematocrit: 42 % (ref 36.0–48.0)
Hemoglobin: 14.6 gm/dL (ref 12.0–16.0)
Lymphocytes Absolute: 2.5 10*3/uL (ref 0.6–5.1)
Lymphocytes: 48.6 % — ABNORMAL HIGH (ref 15.0–46.0)
MCH: 35 pg (ref 28–35)
MCHC: 35 gm/dL (ref 32–36)
MCV: 100 fL (ref 80–100)
MPV: 10.6 fL — ABNORMAL HIGH (ref 6.0–10.0)
Monocytes Absolute: 0.3 10*3/uL (ref 0.1–1.7)
Monocytes: 5.8 % (ref 3.0–15.0)
Neutrophils %: 42.5 % (ref 42.0–78.0)
Neutrophils Absolute: 2.2 10*3/uL (ref 1.7–8.6)
PLT CT: 155 10*3/uL (ref 130–440)
RBC: 4.21 10*6/uL (ref 3.80–5.00)
RDW: 11.6 % (ref 11.0–14.0)
WBC: 5.1 10*3/uL (ref 4.0–11.0)

## 2018-05-21 LAB — BASIC METABOLIC PANEL
Anion Gap: 12.4 mMol/L (ref 7.0–18.0)
BUN / Creatinine Ratio: 9.1 Ratio — ABNORMAL LOW (ref 10.0–30.0)
BUN: 7 mg/dL (ref 7–22)
CO2: 22.2 mMol/L (ref 20.0–30.0)
Calcium: 10.1 mg/dL (ref 8.5–10.5)
Chloride: 107 mMol/L (ref 98–110)
Creatinine: 0.77 mg/dL (ref 0.60–1.20)
EGFR: 98 mL/min/{1.73_m2} (ref 60–150)
Glucose: 76 mg/dL (ref 71–99)
Osmolality Calc: 272 mOsm/kg — ABNORMAL LOW (ref 275–300)
Potassium: 3.6 mMol/L (ref 3.5–5.3)
Sodium: 138 mMol/L (ref 136–147)

## 2018-05-21 NOTE — ED Notes (Signed)
Pt stating she would like to leave. PIV removed.

## 2018-05-31 ENCOUNTER — Emergency Department
Admission: EM | Admit: 2018-05-31 | Discharge: 2018-06-01 | Disposition: A | Discharge status: Home / Self Care | Payer: 59 | Attending: Emergency Medicine | Admitting: Emergency Medicine

## 2018-05-31 DIAGNOSIS — N83202 Unspecified ovarian cyst, left side: Secondary | ICD-10-CM | POA: Insufficient documentation

## 2018-05-31 DIAGNOSIS — N938 Other specified abnormal uterine and vaginal bleeding: Secondary | ICD-10-CM | POA: Insufficient documentation

## 2018-05-31 DIAGNOSIS — F419 Anxiety disorder, unspecified: Secondary | ICD-10-CM | POA: Insufficient documentation

## 2018-05-31 DIAGNOSIS — R112 Nausea with vomiting, unspecified: Secondary | ICD-10-CM | POA: Insufficient documentation

## 2018-05-31 LAB — CBC AND DIFFERENTIAL
Basophils %: 1.2 % (ref 0.0–3.0)
Basophils Absolute: 0.1 10*3/uL (ref 0.0–0.3)
Eosinophils %: 2.3 % (ref 0.0–7.0)
Eosinophils Absolute: 0.1 10*3/uL (ref 0.0–0.8)
Hematocrit: 43.1 % (ref 36.0–48.0)
Hemoglobin: 14.7 gm/dL (ref 12.0–16.0)
Lymphocytes Absolute: 2.2 10*3/uL (ref 0.6–5.1)
Lymphocytes: 44.6 % (ref 15.0–46.0)
MCH: 34 pg (ref 28–35)
MCHC: 34 gm/dL (ref 32–36)
MCV: 101 fL — ABNORMAL HIGH (ref 80–100)
MPV: 9.1 fL (ref 6.0–10.0)
Monocytes Absolute: 0.3 10*3/uL (ref 0.1–1.7)
Monocytes: 6.8 % (ref 3.0–15.0)
Neutrophils %: 45 % (ref 42.0–78.0)
Neutrophils Absolute: 2.3 10*3/uL (ref 1.7–8.6)
PLT CT: 142 10*3/uL (ref 130–440)
RBC: 4.28 10*6/uL (ref 3.80–5.00)
RDW: 11.2 % (ref 11.0–14.0)
WBC: 5 10*3/uL (ref 4.0–11.0)

## 2018-05-31 LAB — HCG, SERUM, QUALITATIVE: BHCG Qualitative: NEGATIVE

## 2018-05-31 MED ORDER — SODIUM CHLORIDE 0.9 % IV BOLUS
1000.0000 mL | Freq: Once | INTRAVENOUS | Status: DC
Start: 2018-05-31 — End: 2018-06-01

## 2018-05-31 NOTE — EDIE (Signed)
COLLECTIVE?NOTIFICATION?05/31/2018 21:17?Megan Pratt, Megan Pratt?MRN: [id number]    Criteria Met      High Utilization (6+ED/6 Months)    Security and Safety  No recent Security Events currently on file    ED Care Guidelines  There are currently no ED Care Guidelines for this patient. Please check your facility's medical records system.      Prescription Monitoring Program  PDMP query found no report.  Narx Score not available at this time.      E.D. Visit Count (12 mo.)  Facility Visits   [facility] 4   [facility] 2   Total 6   Note: Visits indicate total known visits.      Recent Emergency Department Visit Summary  Date Facility [facility] Type Diagnoses or Chief Complaint   May 31, 2018 [facility] [place] [facility]      Vaginal Bleeding      May 20, 2018 [facility] [place] [place] [facility]      Vaginal Bleeding      Apr 12, 2018 [facility] [place] [facility]  Chief Complaint: Diarrhea (female), adult    Apr 06, 2018 [facility] [place] [facility]      0. Melena      1. Urinary tract infection, site not specified      2. Unspecified abdominal pain      3. Headache      4. Major depressive disorder, single episode, unspecified      5. Obesity, unspecified      6. Personal history of traumatic brain injury      7. Epilepsy, unspecified, not intractable, without status epilepticus      8. Gastro-esophageal reflux disease without esophagitis      9. Allergy status to other drugs, medicaments and biological substances status      Mar 09, 2018 [facility] [place] [facility]      0. Delusional disorders      2. Gastro-esophageal reflux disease without esophagitis      3. Overactive bladder      4. Personal history of traumatic brain injury      5. Allergy status to other drugs, medicaments and biological substances status      6. Allergy status to  penicillin      7. Other long term (current) drug therapy      8. Long term (current) use of hormonal contraceptives      9. Nicotine dependence, cigarettes, uncomplicated      Feb 24, 2018 [facility] [place] [facility]      0. Rash and other nonspecific skin eruption      1. Other specified dermatitis      2. Cough      3. Epilepsy, unspecified, not intractable, without status epilepticus      4. Gastro-esophageal reflux disease without esophagitis      5. Polyneuropathy, unspecified      6. Personal history of other diseases of the circulatory system      7. Allergy status to other drugs, medicaments and biological substances status      8. Allergy status to penicillin      9. Long term (current) use of hormonal contraceptives          Recent Inpatient Visit Summary  Date Facility [facility] Type Diagnoses or Chief Complaint  Apr 12, 2018 [facility] [place] [facility]      0. Urinary tract infection, site not specified      2. Dehydration      3. Rash and other nonspecific skin eruption      4. Hypokalemia      5. Schizophrenia, unspecified      6. Diarrhea, unspecified      7. Patient's other noncompliance with medication regimen      8. Allergy status to analgesic agent status      9. Allergy status to anesthetic agent status      10. Allergy status to penicillin          Care Team  Provider [facility] Type Phone Fax Service Dates   [facility] [phone]  Current      Collective Portal  This patient has registered at the [facility] Emergency Department   For more information visit: [email or web address]     PLEASE NOTE:    1.   Any care recommendations and other clinical information are provided as guidelines or for historical purposes only, and providers should exercise their own clinical judgment when providing care.    2.   You may only use this  information for purposes of treatment, payment or health care operations activities, and subject to the limitations of applicable Collective Policies.    3.   You should consult directly with the organization that provided a care guideline or other clinical history with any questions about additional information or accuracy or completeness of information provided.    ? 2019 [place], [employer] - [email or web address]

## 2018-05-31 NOTE — ED Notes (Signed)
Pt jerked during IV placement, infiltrated, and unsuccessful. RN made aware.

## 2018-05-31 NOTE — ED Triage Notes (Signed)
Patient presents for c/o vaginal bleeding with lower abdominal pain x2 weeks. States she is starting to feel weak and dizzy at times. Notes that she was on the depo shot but moved and has not gotten a new doctor yet so she is 1 month overdue.

## 2018-06-01 ENCOUNTER — Emergency Department: Payer: 59

## 2018-06-01 LAB — VH URINALYSIS WITH MICROSCOPIC AND CULTURE IF INDICATED
Bilirubin, UA: NEGATIVE
Glucose, UA: NEGATIVE mg/dL
Ketones UA: NEGATIVE mg/dL
Leukocyte Esterase, UA: 25 Leu/uL — AB
Nitrite, UA: NEGATIVE
Protein, UR: NEGATIVE mg/dL
RBC, UA: 3 /hpf (ref 0–5)
Squam Epithel, UA: 7 /hpf — ABNORMAL HIGH (ref 0–2)
Urine Specific Gravity: 1.012 (ref 1.001–1.040)
Urobilinogen, UA: NORMAL mg/dL
WBC, UA: 5 /hpf — ABNORMAL HIGH (ref 0–4)
pH, Urine: 6 pH (ref 5.0–8.0)

## 2018-06-01 LAB — BASIC METABOLIC PANEL
Anion Gap: 10.1 mMol/L (ref 7.0–18.0)
BUN / Creatinine Ratio: 10.8 Ratio (ref 10.0–30.0)
BUN: 8 mg/dL (ref 7–22)
CO2: 25.5 mMol/L (ref 20.0–30.0)
Calcium: 10.1 mg/dL (ref 8.5–10.5)
Chloride: 107 mMol/L (ref 98–110)
Creatinine: 0.74 mg/dL (ref 0.60–1.20)
EGFR: 102 mL/min/{1.73_m2} (ref 60–150)
Glucose: 79 mg/dL (ref 71–99)
Osmolality Calc: 275 mOsm/kg (ref 275–300)
Potassium: 3.6 mMol/L (ref 3.5–5.3)
Sodium: 139 mMol/L (ref 136–147)

## 2018-06-01 MED ORDER — KETOROLAC TROMETHAMINE 10 MG PO TABS
10.0000 mg | ORAL_TABLET | Freq: Three times a day (TID) | ORAL | 0 refills | Status: AC | PRN
Start: 2018-06-01 — End: ?

## 2018-06-01 NOTE — ED Provider Notes (Signed)
[facility] EMERGENCY DEPARTMENT History and Physical Exam      Patient Name: Megan Pratt, Megan Pratt  Encounter Date:  05/31/2018  Attending Physician: Joesphine Bare, MD  PCP: Marisa Sprinkles, MD  Patient DOB:  01/23/1976  MRN:  [id number]  Room:  S29/S29-A      History of Presenting Illness     Chief complaint: Vaginal Bleeding    HPI/ROS is limited by: none  HPI/ROS given by: patient    Location: Vaginal  Duration: 2 to 3 weeks  Severity: mild    Megan Pratt is a 42 y.o. female who presents with 2 to 3 weeks of vaginal bleeding.  The patient does not believe she is pregnant.  She notes that she does take the Depo-Provera shot.  She apparently missed a shot.  The patient she began having heavy bleeding.  He has c continued.  There is been no history of any blackouts.  She notes mild cough.  This is on review of symptoms.  She denies any history of any near syncopal symptoms other than some mild lightheadedness.  She denies any history of any epigastric pain.  She noted left upper quadrant pain to the nurse at triage but denies this to myself.  She has had no history of any other arthralgias or myalgias.  No unintended weight loss or night sweats.  She recently moved from [place] to [facility].  She is not sought any other medical attention for these current symptoms.  There is been no history of any dysphagia.  No signs or symptoms of airway compromise no tongue swelling no lip swelling.  She notes she had some postnasal drip with some scratchiness to her throat on review of system.  There is been no unintended weight loss.  There is been no diarrhea.  She denies any melena no hematochezia.  No current dysuria no hematuria.    Review of Systems   Review of Systems   Constitutional: Negative.  Negative for chills and fever.   HENT: Positive for congestion and sore throat.    Eyes: Negative.    Respiratory: Negative.  Negative for cough, sputum production and shortness of breath.    Cardiovascular:  Negative.  Negative for chest pain, palpitations, orthopnea, claudication and leg swelling.   Gastrointestinal: Positive for nausea and vomiting. Negative for abdominal pain, blood in stool, constipation and diarrhea.   Genitourinary: Negative.  Negative for dysuria, flank pain, frequency, hematuria and urgency.   Musculoskeletal: Negative.  Negative for back pain, falls, joint pain, myalgias and neck pain.   Skin: Negative.  Negative for rash.   Neurological: Negative for dizziness, sensory change, focal weakness, loss of consciousness and headaches.   Endo/Heme/Allergies: Negative.    Psychiatric/Behavioral: Negative.    All other systems reviewed and are negative.          Allergies     Pt is allergic to penicillins.    Medications       Current Facility-Administered Medications:   .  sodium chloride 0.9 % bolus 1,000 mL, 1,000 mL, Intravenous, Once in ED, Joesphine Bare, MD    Current Outpatient Prescriptions:   .  medroxyPROGESTERone Acetate (DEPO-PROVERA IM), Inject into the muscle, Disp: , Rfl:    Medications were reviewed by md  Past Medical History     Pt  Past Medical History:   Diagnosis Date   . Anemia    . Anxiety    . Bipolar 1 disorder    .  Borderline personality disorder    . Depression    . Ovarian cyst    . Overactive bladder    . PTSD (post-traumatic stress disorder)      Past medical history was reviewed by md  Past Surgical History     Pt  Past Surgical History:   Procedure Laterality Date   . TUBAL LIGATION       Past surgical history was reviewed by md  Family History   History reviewed. No pertinent family history.  The family history was reviewed by md  Social History     Pt  Social History     Social History   . Marital status: Married     Spouse name: N/A   . Number of children: N/A   . Years of education: N/A     Social History Main Topics   . Smoking status: Current Every Day Smoker     Packs/day: 0.50     Types: Cigarettes   . Smokeless tobacco: Never Used   . Alcohol use No   .  Drug use: No   . Sexual activity: Not Asked     Other Topics Concern   . None     Social History Narrative   . None     Social history reviewed by md  Physical Exam     Blood pressure (!) 131/96, pulse 83, temperature 98.2 F (36.8 C), temperature source Oral, resp. rate 17, height 1.676 m, weight 94.8 kg, last menstrual period 05/17/2018, SpO2 99 %.    Physical Exam   Constitutional: She is oriented to person, place, and time and well-developed, well-nourished, and in no distress. No distress.   Regular rhythm without murmur rubs or gallops. No bruits. Good peripheral pulses. Good cap refill. Symmetrical pulses. No abdominal bruits     HENT:   Head: Normocephalic and atraumatic.   Right Ear: External ear normal.   Left Ear: External ear normal.   Nose: Nose normal.   Mouth/Throat: Oropharynx is clear and moist.   Eyes: Pupils are equal, round, and reactive to light. Conjunctivae and EOM are normal.   Neck: Normal range of motion. Neck supple. No JVD present.   Cardiovascular: Normal rate, regular rhythm, normal heart sounds and intact distal pulses.  Exam reveals no gallop and no friction rub.    No murmur heard.  Regular rhythm without murmur rubs or gallops. No bruits. Good peripheral pulses. Good cap refill. Symmetrical pulses. No abdominal bruits     Pulmonary/Chest: Effort normal and breath sounds normal. No stridor. No respiratory distress. She has no wheezes. She has no rales.   Abdominal: Soft. Bowel sounds are normal. She exhibits no distension and no mass. There is no tenderness. There is no rebound and no guarding.   Completely benign abdomen   Genitourinary:   Genitourinary Comments: Pubic masses no CVA tenderness.  Pelvic declined by patient   Musculoskeletal: Normal range of motion. She exhibits no edema or tenderness.   Neurological: She is alert and oriented to person, place, and time. No cranial nerve deficit. She exhibits normal muscle tone. Gait normal. Coordination normal. GCS score is 15.    Skin: Skin is warm and dry. No rash noted. She is not diaphoretic. No erythema. No pallor.   Psychiatric: Mood, memory, affect and judgment normal.   Nursing note and vitals reviewed.          Orders Placed     Orders Placed This Encounter   Procedures   .  Urine Culture   . US Pelvis Complete (NON OB)   . CBC and differential   . Beta HCG, Qual, Serum   . Urinalysis w Microscopic and Culture if Indicated   . Basic Metabolic Panel   . Saline lock IV       Diagnostic Results       The results of the diagnostic studies below have been reviewed by myself:    Labs  Results     Procedure Component Value Units Date/Time    Basic Metabolic Panel [[id number]] Collected:  06/01/18 0016    Specimen:  Plasma Updated:  06/01/18 0057     Sodium 139 mMol/L      Potassium 3.6 mMol/L      Chloride 107 mMol/L      CO2 25.5 mMol/L      Calcium 10.1 mg/dL      Glucose 79 mg/dL      Creatinine 4.09 mg/dL      BUN 8 mg/dL      Anion Gap 81.1 mMol/L      BUN/Creatinine Ratio 10.8 Ratio      EGFR 102 mL/min/1.74m2      Osmolality Calc 275 mOsm/kg     Narrative:       This order is a replacement of the rejected order with accession number [id number].    Urinalysis w Microscopic and Culture if Indicated [[id number]]  (Abnormal) Collected:  05/31/18 2335    Specimen:  Urine, Random Updated:  06/01/18 0003     Color, UA Yellow     Clarity, UA Clear     Specific Gravity, UR 1.012     pH, Urine 6.0 pH      Protein, UR Negative mg/dL      Glucose, UA Negative mg/dL      Ketones UA Negative mg/dL      Bilirubin, UA Negative     Blood, UA Large (A)     Nitrite, UA Negative     Urobilinogen, UA Normal mg/dL      Leukocyte Esterase, UA 25 (A) Leu/uL      UR Micro Performed     WBC, UA 5 (H) /hpf      RBC, UA 3 /hpf      Squam Epithel, UA 7 (H) /hpf     Narrative:       A Urine Culture has been ordered based upon the Positive UA results.    Beta HCG, Drema [place], Serum [[id number]] Collected:  05/31/18 2318    Specimen:  Plasma Updated:  05/31/18 2352      BHCG Qual Negative    CBC and differential [[id number]]  (Abnormal) Collected:  05/31/18 2318    Specimen:  Blood from Blood Updated:  05/31/18 2341     WBC 5.0 K/cmm      RBC 4.28 M/cmm      Hemoglobin 14.7 gm/dL      Hematocrit 25.3 %      MCV 101 (H) fL      MCH 34 pg      MCHC 34 gm/dL      RDW 66.4 %      PLT CT 142 K/cmm      MPV 9.1 fL      NEUTROPHIL % 45.0 %      Lymphocytes 44.6 %      Monocytes 6.8 %      Eosinophils % 2.3 %      Basophils % 1.2 %  Neutrophils Absolute 2.3 K/cmm      Lymphocytes Absolute 2.2 K/cmm      Monocytes Absolute 0.3 K/cmm      Eosinophils Absolute 0.1 K/cmm      BASO Absolute 0.1 K/cmm           Radiologic Studies  Radiology Results (24 Hour)     Procedure Component Value Units Date/Time    US Pelvis Complete (NON OB) [[id number]] Collected:  06/01/18 0131    Order Status:  Completed Updated:  06/01/18 0134    Narrative:       CLINICAL HISTORY:  vaginal bleeding. Last menstrual cycle was on 05/14/2018      EXAMINATION:  US PELVIS COMPLETE    COMPARISON:  None    TECHNIQUE:  Transabdominal pelvic ultrasound. Patient declined transvaginal pelvic ultrasound.    FINDINGS:     Uterus has normal echotexture, contour, and size measuring about 8.2 x 4.8 x 4.8 cm.    Endometrial echocomplex is normal echotexture and thickness measuring about 9 mm.    Right ovary is not seen    Left ovary has normal echo fissure, contour, color Doppler flow and size measuring about 3.1 x 2.3 x 2.1 cm containing a 2.2 cm cyst.    No significant free pelvic fluid.      Impression:       2.2 cm left ovarian cyst.    ReadingStation:[id number]          EKG: none      MDM / Critical Care     Blood pressure (!) 131/96, pulse 83, temperature 98.2 F (36.8 C), temperature source Oral, resp. rate 17, height 1.676 m, weight 94.8 kg, last menstrual period 05/17/2018, SpO2 99 %.    This patient presented to the Emergency Department with pelvic pain.   Based on the patients presentation and evaluation there are no  signs of life-threatening, surgical or serious etiology.It is necessary to consider threatened abortion, ectopic pregnancy, subchorionic hemorrhage.  My suspicion for ectopic  Pregnancy, TOA, uterine rupture, placental abruption, as well as previa is relatively low and not well supported by exam or  History.  There is no strong evidence to support infectious etiology.    The patient was given precautions and instructions to return if symptoms worsen or change in any way, or in 8-12 hours if not improved for re-evaluation.  Follow-up with the patient's primary care physician or a suggested specialist was encouraged.  The patient was well-appearing at time of disposition and given pelvic pain precautions.  Diagnostic impression and plan were discussed with the patient and/or family.  Results of lab/radiology tests were discussed with the patient and/or family. All questions were answered and concerns addressed.    1:43 AM --she was resting comfortably.  She will follow-up with her primary OB/GYN.  She was told to return for any symptoms new concerns worsening symptoms or concerns.  She was placed on Toradol for intermittent discomfort.  We asked if any further concerns or not address she stated no.  She will take over-the-counter Benadryl for congestion with postnasal drip versus utilizing Claritin.  We recommended follow-up closely with OB/gyn    She had no further issues that she did not feel were addressed  Procedures             Diagnosis / Disposition     Clinical Impression  1. Left ovarian cyst    2. Dysfunctional uterine bleeding        Disposition  ED  Disposition     ED Disposition Condition Date/Time Comment    Discharge  Thu Jun 01, 2018  1:42 AM Lennette Bihari discharge to home/self care.    Condition at disposition: Stable          Prescriptions  New Prescriptions    No medications on file                  Joesphine Bare, MD  06/01/18 [phone]

## 2018-06-01 NOTE — Discharge Instructions (Signed)
Ovarian Cysts  A cyst is usually a fluid-filled sac, like a small water balloon. Cysts are almost always harmless, and many go away on their own. Usually they grow slowly. They can vary in size from as small as a pea to larger than a grapefruit. Many cause no symptoms at all. Often they are felt only during a pelvic exam. Ovarian cysts are usually not cancer.    Functional cyst  A functional cyst is the most common kind of cyst. It forms when a follicle does not release a mature egg or continues to grow after releasing the egg. Functional cysts usually occur on onlyone ovary at a time. They usually shrink on their own in 1 to 3 months. In rare cases, a cyst will burst (rupture), causing pain. Pain might also be caused by the twisting of an ovary that is enlarged because of the cyst growing on it.    Dermoid cyst  Sometimescells that are present from birthwill start to grow into different kinds of tissue such as skin, fat, hair, and teeth. This kind of cyst is called a dermoid cyst. Dermoid cysts can grow onone or both ovaries. Usually they cause no symptoms. But if they leak orthe ovary becomestwisted, they can cause severe pain.    Endometrioma  Sometimes tissue similar to the lining of the uterus (endometrium) grows and becomes partof the ovary. This kind of cyst is often called a chocolate cyst because of its dark-brown color. These cysts can grow onone or both ovaries. They often cause pain, especially around menstruation or during sex.    Benign cystadenoma  If the capsule that surrounds the ovary grows, it can form a cystadenoma.These cysts can grow on one or both ovaries. Usually they cause no symptoms if they are small. But if they become large, they can press on organs near the ovaries, causing pain. They can also cause pain by stretching the ovarian capsule. A cyst that pushes on the bladder can cause frequent urination. Sometimes these cysts rupture and bleed.  Malignant cysts  These cysts  can invade other tissues or spread to other parts of the body.  Date Last Reviewed: 04/08/2016   2000-2019 [employer] [address], [place] [address]. All rights reserved. This information is not intended as a substitute for professional medical care. Always follow your healthcare professional's instructions.          Dysfunctional Uterine Bleeding    Dysfunctional uterine bleeding, also called abnormal uterine bleeding, is a condition in which bleeding is abnormaland occurs at unexpected times of the month. This happens because of changes in the hormones that help control a woman's menstrual cycle each month.  The bleeding may be heavier or lighter than normal. If you have heavy bleeding often, this can lead to a problem called anemia.With anemia, your red blood cell count is too low. Red blood cells help carry oxygen throughout your body.Severe anemia may cause you to look pale and feel very weak or tired. You might also become short of breath easily.  To treat dysfunctional uterine bleeding, medicines are often tried first. If these don't help, or if you have additional symptoms or have reached menopause, further testing and treatments may be needed. Discuss all of your options with your provider.  Home care  Medicines  If you're prescribed medicines, be sure to take them as directed. Some of the more common medicines you may be prescribed include:   Hormone therapy (Options include most   methods of hormonal birth control such as pills, shots, or a hormone-releasing IUD)   Nonsteroidal anti-inflammatory drugs (NSAIDs), such as ibuprofen   Iron supplements, if you have anemia  General care   Get plenty of rest if you tire easily. Avoid heavy exertion.   To help relieve pain or cramping that may occur with bleeding, try using a heating pad on the lower belly or back. A warm bath may also help.  Follow-up care  Follow up with your healthcare provider, or as directed.  When to seek  medical advice  Call your healthcare provider right away if:   Bleeding becomes heavy (soaking 1 pad or tampon every hour for 3 hours)   Increased abdominal pain   Irregular bleeding worsens or does not get better even with treatment   Fever of 100.4F (38C) or higher, or as directed by your provider   Signs of anemia, such as pale skin, extreme fatigue or weakness, or shortness of breath   Dizziness or fainting  Date Last Reviewed: 09/08/2016   2000-2019 [employer] [address], [place] [address]. All rights reserved. This information is not intended as a substitute for professional medical care. Always follow your healthcare professional's instructions.

## 2018-06-07 ENCOUNTER — Telehealth: Payer: Self-pay

## 2018-06-07 NOTE — Telephone Encounter (Signed)
Received a referral from the [facility] ED for the Transition Clinic. I called and left a message on the patient's voicemail explaining the role of the Transition Clinic and requested she call back if she's interested in scheduling an appointment.

## 2018-06-26 NOTE — Progress Notes (Deleted)
PROGRESS NOTE      Megan Pratt is a 42 y.o. female patient.          Past Medical History:   Diagnosis Date   . Anemia    . Anxiety    . Bipolar 1 disorder    . Borderline personality disorder    . Depression    . Ovarian cyst    . Overactive bladder    . PTSD (post-traumatic stress disorder)      Past Surgical History:   Procedure Laterality Date   . TUBAL LIGATION       OB History     No data available        No family history on file.  Social History     Social History   . Marital status: Married     Spouse name: N/A   . Number of children: N/A   . Years of education: N/A     Occupational History   . Not on file.     Social History Main Topics   . Smoking status: Current Every Day Smoker     Packs/day: 0.50     Types: Cigarettes   . Smokeless tobacco: Never Used   . Alcohol use No   . Drug use: No   . Sexual activity: Not on file     Other Topics Concern   . Not on file     Social History Narrative   . No narrative on file     Current Outpatient Prescriptions on File Prior to Visit   Medication Sig Dispense Refill   . ketorolac (TORADOL) 10 MG tablet Take 1 tablet (10 mg total) by mouth 3 (three) times daily as needed for Pain 10 tablet 0   . medroxyPROGESTERone Acetate (DEPO-PROVERA IM) Inject into the muscle       No current facility-administered medications on file prior to visit.        Allergies   Allergen Reactions   . Penicillins        LMP: ***     Last Pap: ***   Last MMG: ***   Contraception: BTL   G***P***A***      HPI: The patient is a 42 y.o. year old *** female who comes in today as a new patient for {kwannual:35336} . {kwcomplaints:35337} .  Her past history is significant for ***.    ROS    There were no vitals taken for this visit.      Physical  Exam    ASSESSMENT:     No diagnosis found.    PLAN:    Requested Prescriptions      No prescriptions requested or ordered in this encounter       No orders of the defined types were placed in this encounter.      Shelba Flake, MD  06/26/2018

## 2018-06-27 ENCOUNTER — Ambulatory Visit: Payer: 59 | Admitting: Obstetrics & Gynecology

## 2018-07-05 ENCOUNTER — Ambulatory Visit: Payer: 59 | Admitting: Primary Podiatric Medicine

## 2018-11-15 IMAGING — CT CT HEAD W/O CM
4 series · 17 of 47 positions shown, 19 images · non-contrast
Comparison: None.

CLINICAL DATA: Fall with posterior head pain.

EXAM:
CT HEAD WITHOUT CONTRAST
TECHNIQUE: Contiguous axial images were obtained from the base of the skull
through the vertex without intravenous contrast.

[Series 3: head bone · axial · 0.44mm/px · z∈[-129,-71]mm · 4 of 85 slices shown]
[im 9/85  bone]
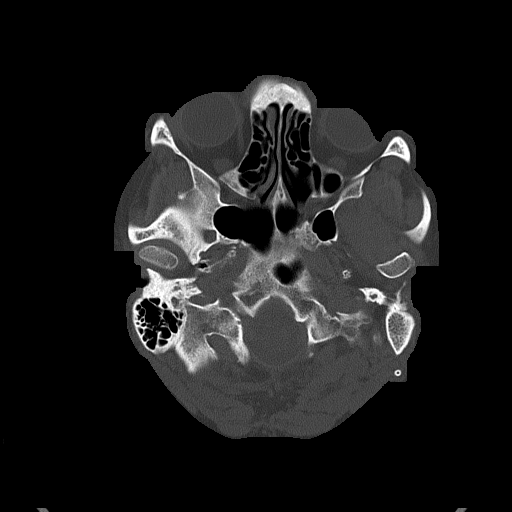
[im 17/85  bone]
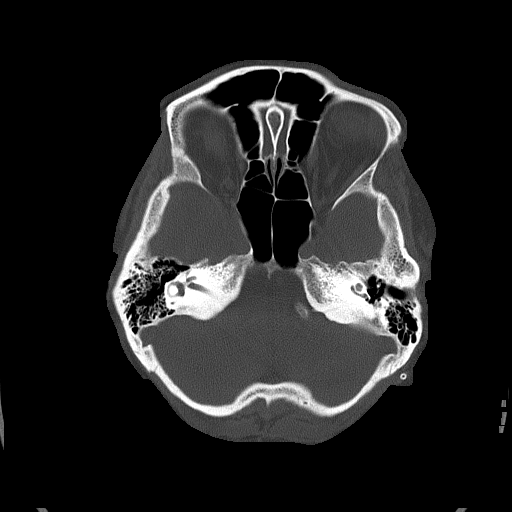
[im 26/85  bone]
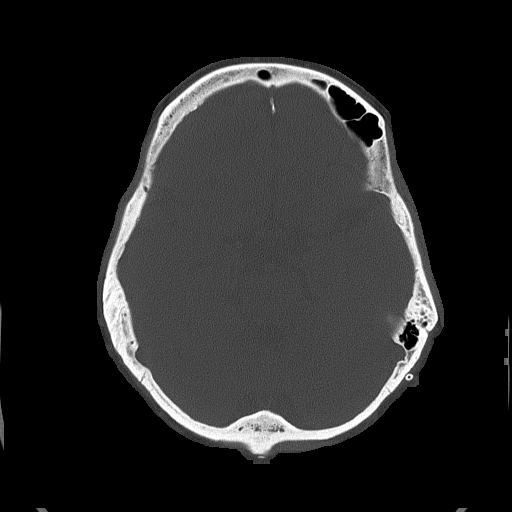
[im 38/85  bone]
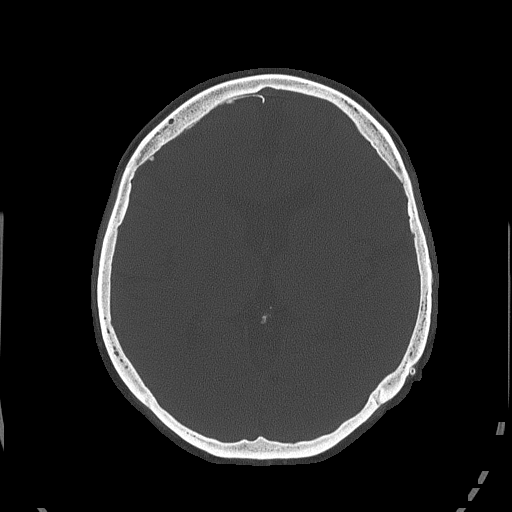

[Series 4: head without · axial · non-contrast · 0.44mm/px · z∈[-125,-5]mm · 7 of 34 slices shown, 9 images]
[im 5/34  brain]
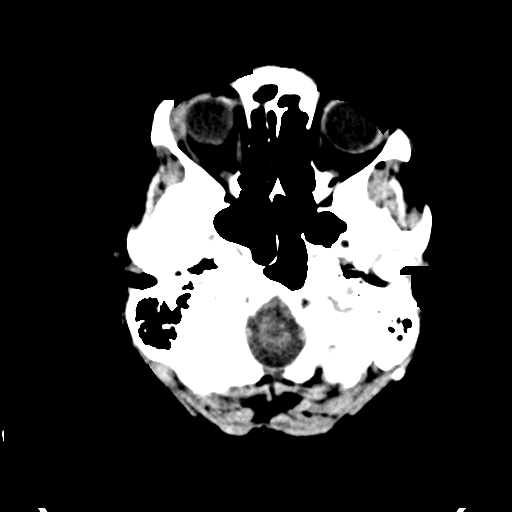
[im 5/34  bone]
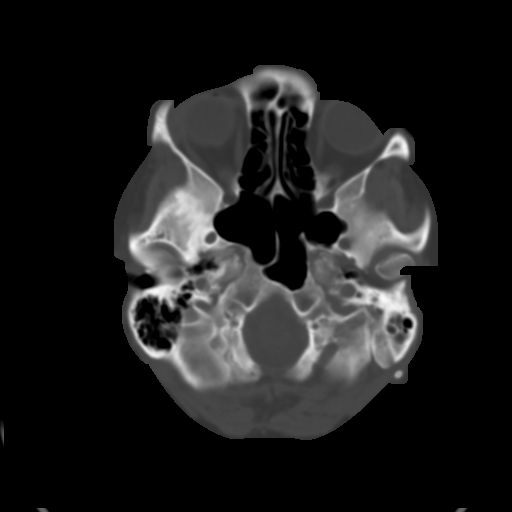
[im 9/34  brain]
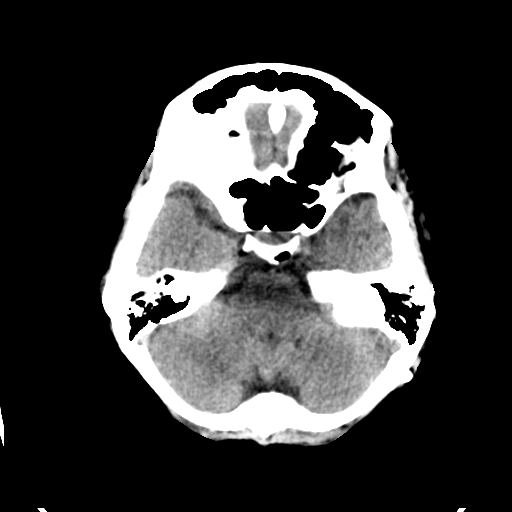
[im 13/34  brain]
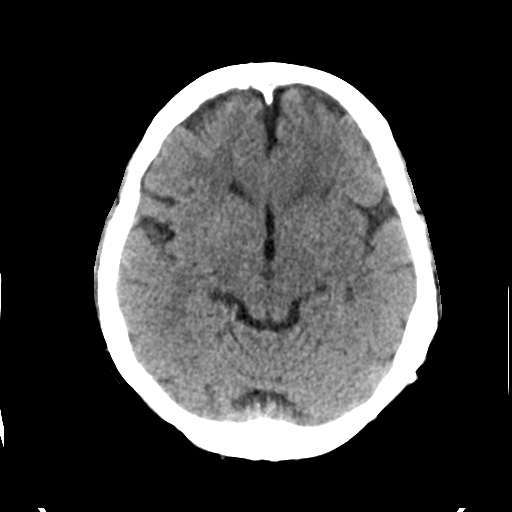
[im 17/34  brain]
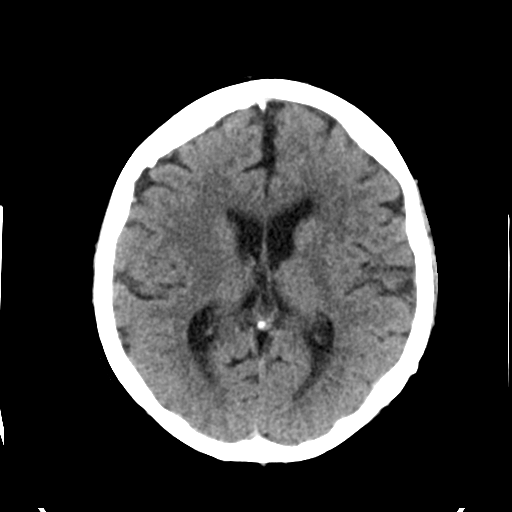
[im 21/34  brain]
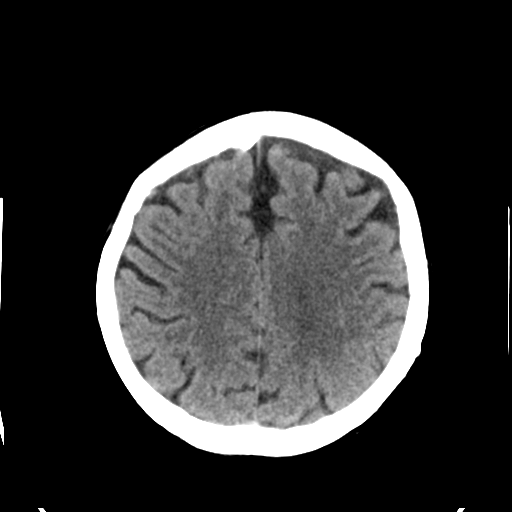
[im 21/34  bone]
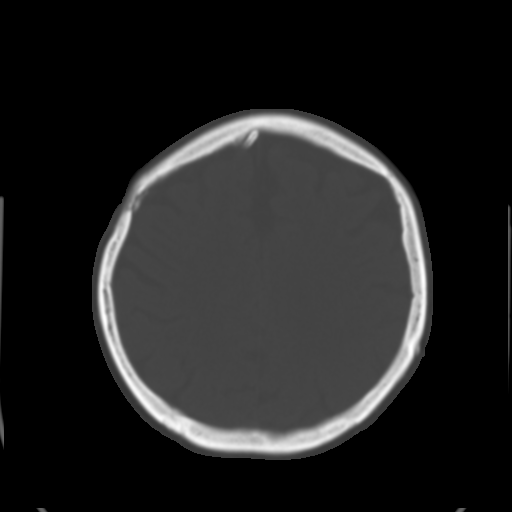
[im 25/34  brain]
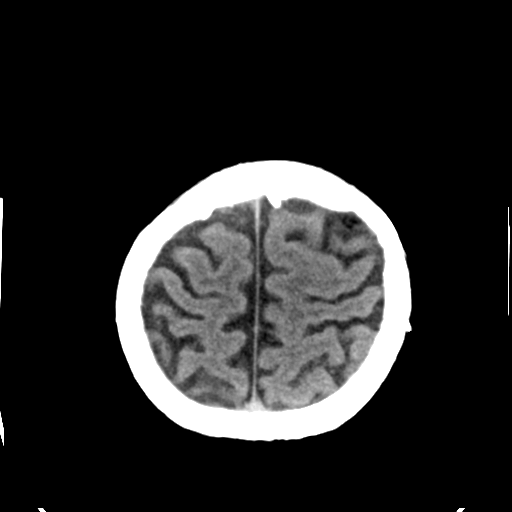
[im 29/34  brain]
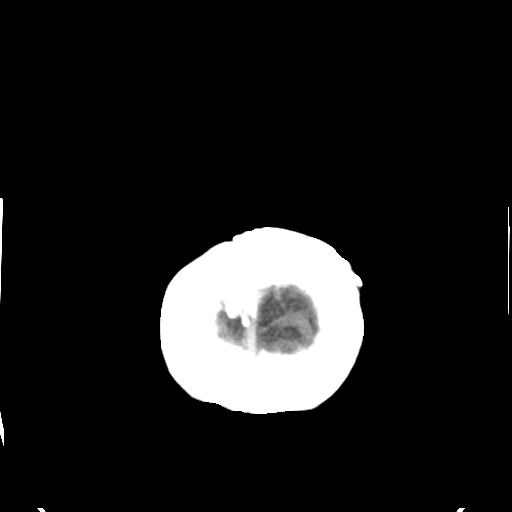

[Series 5: head without cor · coronal · non-contrast · 0.37mm/px · 3 of 66 slices shown]
[im 22/66  brain]
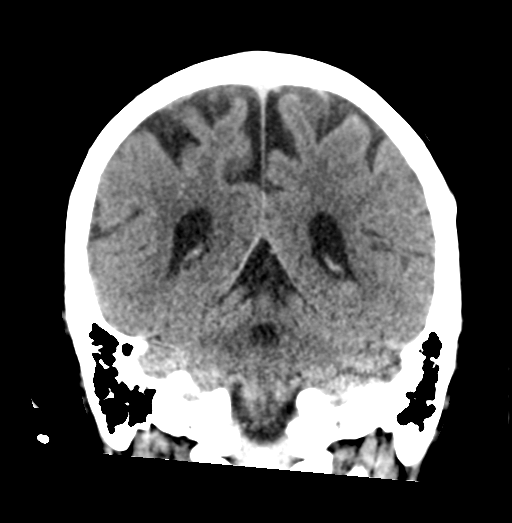
[im 29/66  brain]
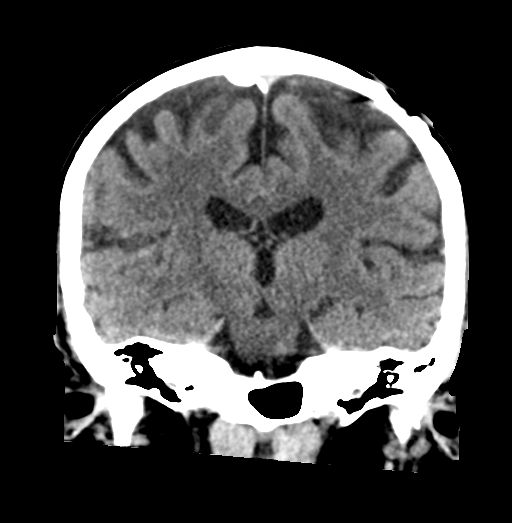
[im 37/66  brain]
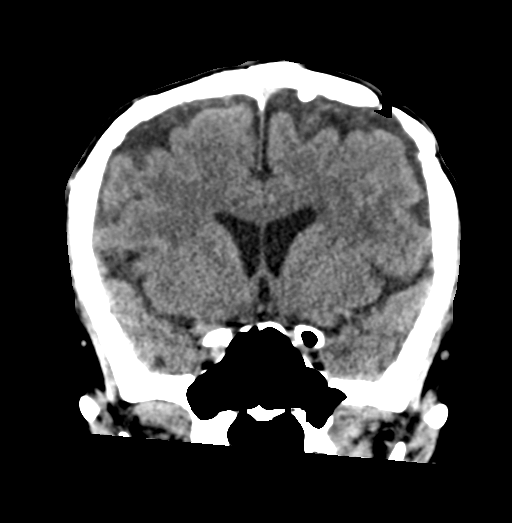

[Series 6: head without sag · sagittal · non-contrast · 0.33mm/px · 3 of 59 slices shown]
[im 20/59  brain]
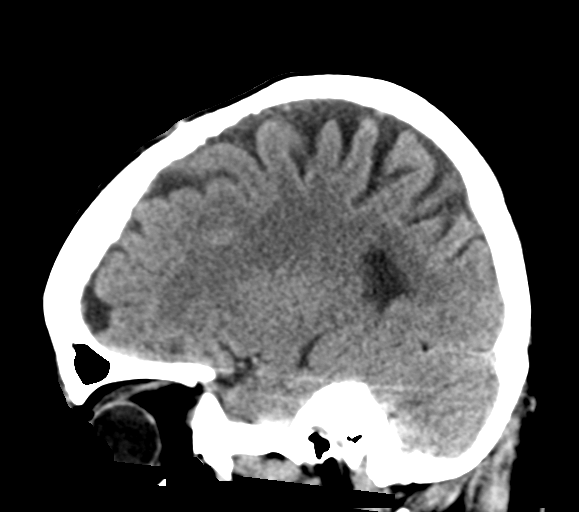
[im 30/59  brain]
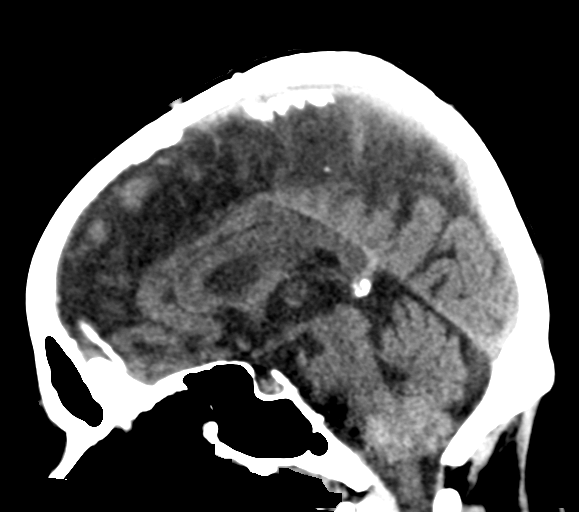
[im 39/59  brain]
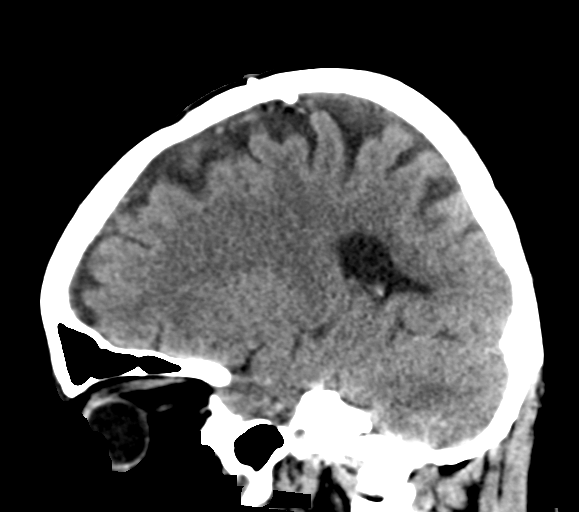

[17 of 47 positions shown; findings below may reference images not displayed]

FINDINGS: Brain: There is shunt tubing that travels through the anterior
extra-axial space VA bifrontal approach. There are extra-axial
calcifications along the right frontal convexity. There is no mass,
intraparenchymal hemorrhage or extra-axial collection. The
ventricles are mildly enlarged for age. No focal parenchymal
abnormality.

Vascular: No hyperdense vessel or unexpected calcification.

Skull: No skull fracture

Sinuses/Orbits: The visualized portions of the paranasal sinuses and
mastoid air cells are free of fluid. No advanced mucosal thickening.
The visualized orbits are normal.

Other: None
IMPRESSION: 1. No acute intracranial abnormality.
2. Shunt tubing travels through the anterior extra-axial space.
Mildly enlarged ventricles for age, possibly due to underlying
atrophy.

## 2021-03-31 ENCOUNTER — Emergency Department

## 2021-03-31 ENCOUNTER — Inpatient Hospital Stay
Admit: 2021-03-31 | Discharge: 2021-04-01 | Disposition: A | Discharge status: Other Acute Inpatient Hospital | Payer: MEDICAID | Attending: Emergency Medicine

## 2021-03-31 DIAGNOSIS — R42 Dizziness and giddiness: Secondary | ICD-10-CM

## 2021-03-31 LAB — CBC WITH AUTO DIFFERENTIAL
Basophils %: 0 % (ref 0–1)
Basophils Absolute: 0 10*3/uL (ref 0.0–0.1)
Eosinophils %: 0 % (ref 0–7)
Eosinophils Absolute: 0 10*3/uL (ref 0.0–0.4)
Granulocyte Absolute Count: 0 10*3/uL (ref 0.00–0.04)
Hematocrit: 38.8 % (ref 35.0–47.0)
Hemoglobin: 11.9 g/dL (ref 11.5–16.0)
Immature Granulocytes: 0 % (ref 0–0.5)
Lymphocytes %: 25 % (ref 12–49)
Lymphocytes Absolute: 1.4 10*3/uL (ref 0.8–3.5)
MCH: 26.4 PG (ref 26.0–34.0)
MCHC: 30.7 g/dL (ref 30.0–36.5)
MCV: 86.2 FL (ref 80.0–99.0)
Monocytes %: 6 % (ref 5–13)
Monocytes Absolute: 0.3 10*3/uL (ref 0.0–1.0)
NRBC Absolute: 0 10*3/uL (ref 0.00–0.01)
Neutrophils %: 69 % (ref 32–75)
Neutrophils Absolute: 3.6 10*3/uL (ref 1.8–8.0)
Nucleated RBCs: 0 PER 100 WBC
Platelets: 174 10*3/uL (ref 150–400)
RBC: 4.5 M/uL (ref 3.80–5.20)
RDW: 22.3 % — ABNORMAL HIGH (ref 11.5–14.5)
WBC: 5.4 10*3/uL (ref 3.6–11.0)

## 2021-03-31 LAB — DRUG SCREEN, URINE
AMPHETAMINES: NEGATIVE
Amphetamine Screen, Urine: NEGATIVE
BARBITURATES: NEGATIVE
BENZODIAZEPINES: NEGATIVE
Barbiturate Screen, Urine: NEGATIVE
Benzodiazepine Screen, Urine: NEGATIVE
COCAINE: NEGATIVE
Cocaine Screen Urine: NEGATIVE
METHADONE: NEGATIVE
Methadone Screen, Urine: NEGATIVE
OPIATES: NEGATIVE
Opiate Screen, Urine: NEGATIVE
PCP Screen, Urine: NEGATIVE
PCP(PHENCYCLIDINE): NEGATIVE
THC (TH-CANNABINOL): NEGATIVE
THC Screen, Urine: NEGATIVE

## 2021-03-31 LAB — EKG 12-LEAD
Atrial Rate: 76 {beats}/min
Diagnosis: NORMAL
P Axis: 22 degrees
P-R Interval: 132 ms
Q-T Interval: 424 ms
QRS Duration: 88 ms
QTc Calculation (Bazett): 477 ms
R Axis: 2 degrees
T Axis: 16 degrees
Ventricular Rate: 76 {beats}/min

## 2021-03-31 LAB — URINALYSIS W/ REFLEX CULTURE
Bilirubin, Urine: NEGATIVE
Bilirubin: NEGATIVE
Blood, Urine: NEGATIVE
Blood: NEGATIVE
Glucose, Ur: NEGATIVE mg/dL
Glucose: NEGATIVE mg/dL
Ketone: NEGATIVE mg/dL
Ketones, Urine: NEGATIVE mg/dL
Nitrite, Urine: NEGATIVE
Nitrites: NEGATIVE
Protein, UA: NEGATIVE mg/dL
Protein: NEGATIVE mg/dL
Specific Gravity, UA: <1.005 NA (ref 1.003–1.030)
Specific gravity: <1.005 (ref 1.003–1.030)
Urobilinogen, UA, POCT: 0.1 EU/dL (ref 0.1–1.0)
Urobilinogen: 0.1 EU/dL (ref 0.1–1.0)
pH (UA): 6 (ref 5.0–8.0)
pH, UA: 6 (ref 5.0–8.0)

## 2021-03-31 LAB — COVID-19 WITH INFLUENZA A/B
Influenza A By PCR: NOT DETECTED
Influenza A by PCR: NOT DETECTED
Influenza B By PCR: NOT DETECTED
Influenza B by PCR: NOT DETECTED
SARS-CoV-2 by PCR: NOT DETECTED
SARS-CoV-2: NOT DETECTED

## 2021-03-31 LAB — SALICYLATE
Salicylate level: 2.4 mg/dL — ABNORMAL LOW (ref 2.8–20.0)
Salicylate: 2.4 mg/dL — ABNORMAL LOW (ref 2.8–20.0)

## 2021-03-31 LAB — ACETAMINOPHEN LEVEL: Acetaminophen Level: <10 ug/mL — ABNORMAL LOW (ref 10–30)

## 2021-03-31 LAB — EKG, 12 LEAD, INITIAL
Atrial Rate: 76 {beats}/min
Calculated P Axis: 22 degrees
Calculated R Axis: 2 degrees
Calculated T Axis: 16 degrees
Diagnosis: NORMAL
P-R Interval: 132 ms
Q-T Interval: 424 ms
QRS Duration: 88 ms
QTC Calculation (Bezet): 477 ms
Ventricular Rate: 76 {beats}/min

## 2021-03-31 LAB — CBC WITH AUTOMATED DIFF
ABS. BASOPHILS: 0 10*3/uL (ref 0.0–0.1)
ABS. EOSINOPHILS: 0 10*3/uL (ref 0.0–0.4)
ABS. IMM. GRANS.: 0 10*3/uL (ref 0.00–0.04)
ABS. LYMPHOCYTES: 1.4 10*3/uL (ref 0.8–3.5)
ABS. MONOCYTES: 0.3 10*3/uL (ref 0.0–1.0)
ABS. NEUTROPHILS: 3.6 10*3/uL (ref 1.8–8.0)
ABSOLUTE NRBC: 0 10*3/uL (ref 0.00–0.01)
BASOPHILS: 0 % (ref 0–1)
EOSINOPHILS: 0 % (ref 0–7)
HCT: 38.8 % (ref 35.0–47.0)
HGB: 11.9 g/dL (ref 11.5–16.0)
IMMATURE GRANULOCYTES: 0 % (ref 0–0.5)
LYMPHOCYTES: 25 % (ref 12–49)
MCH: 26.4 PG (ref 26.0–34.0)
MCHC: 30.7 g/dL (ref 30.0–36.5)
MCV: 86.2 FL (ref 80.0–99.0)
MONOCYTES: 6 % (ref 5–13)
NEUTROPHILS: 69 % (ref 32–75)
NRBC: 0 PER 100 WBC
PLATELET: 174 10*3/uL (ref 150–400)
RBC: 4.5 M/uL (ref 3.80–5.20)
RDW: 22.3 % — ABNORMAL HIGH (ref 11.5–14.5)
WBC: 5.4 10*3/uL (ref 3.6–11.0)

## 2021-03-31 LAB — ACETAMINOPHEN: Acetaminophen level: <10 ug/mL — ABNORMAL LOW (ref 10–30)

## 2021-03-31 MED ORDER — NICOTINE 14 MG/24 HR DAILY PATCH
14 mg/24 hr | Freq: Every day | TRANSDERMAL | Status: DC
Start: 2021-03-31 — End: 2021-03-31

## 2021-03-31 MED ORDER — ONDANSETRON 4 MG TAB, RAPID DISSOLVE
4 mg | ORAL | Status: AC
Start: 2021-03-31 — End: 2021-03-31

## 2021-03-31 MED ORDER — NICOTINE 14 MG/24 HR DAILY PATCH
14 mg/24 hr | TRANSDERMAL | Status: DC
Start: 2021-03-31 — End: 2021-04-01

## 2021-03-31 NOTE — ED Notes (Signed)
Patient stated she was smoking a cigarette and became dizzy. Patient also stated that the enemy is after her and causing her to want to do bad things. Patient recently released from [facility] and just got her meds filled yesterday.

## 2021-03-31 NOTE — ED Notes (Signed)
 Past Medical History:   Diagnosis Date   . IBS (irritable bowel syndrome)      History reviewed. No pertinent surgical history.    Care assumed and bedside SBAR report endorsed on 45 y.o. female with a past medical history significant for Depression presents to the ED with cc of dizziness. States she smoked a cigarette and then felt dizzy. Does not feel dizzy now. Denies CP, SOB, F/C. Has had upset stomach for past 2d with watery diarrhea ~4 episodes. Denies abd pain, sick contacts, dysuria, frequency. Currently homeless staying in a motel. Denies SI/HI/VH but endorses AH. States they do sometimes tell her to do bad things, including self-harm but does not want to self-harm and states she relies on her faith and knowledge that god is protecting her and jesus has a plan for her. Pt alert and oriented x3, pain currently within manageable limits, plan of care reinforced, pt in green gown, personal belongings secured, bed in lowest position, side rails up x2, call bell within reach, room secure, frequent checks q71min monitoring, awaiting results of bed search for placement, will continue to monitor.  1:26 AM  Pt resting comfortably, sleeping but easily aroused, no sign or symptoms of pain or discomfort, VS WNL, frequent checks, bed in lowest position, side rails up x2, sitter at bedside for safety, call bell within reach, will continue to monitor.  4:29 AM  Report called to Beverley RN using SBAR format to include, MAR, plan of care, procedures and treatments, admission pending transport.

## 2021-03-31 NOTE — ED Provider Notes (Signed)
ED Provider Notes by Arizona Constable, MD at 03/31/21 1044                Author: Arizona Constable, MD  Service: EMERGENCY  Author Type: Physician       Filed: 03/31/21 1505  Date of Service: 03/31/21 1044  Status: Signed          Editor: Arizona Constable, MD (Physician)               EMERGENCY DEPARTMENT HISTORY AND PHYSICAL EXAM           Date: 03/31/2021   Patient Name: Cynthia Soto           History of Presenting Illness          Chief Complaint       Patient presents with        ?  Dizziness           History Provided By: Patient      HPI: Cynthia Soto,  45 y.o. female with a past medical history significant for Depression presents to the ED  with cc of dizziness. States she smoked a cigarette and then felt dizzy. Does not feel dizzy now. Denies CP, SOB, F/C. Has had upset stomach for past 2d with watery diarrhea ~4 episodes. Denies abd pain, sick contacts, dysuria, frequency. Currently homeless  staying in a motel. Denies SI/HI/VH but endorses AH. States they do sometimes tell her to do bad things, including self-harm but does not want to self-harm and states she relies on her faith and knowledge that god is protecting her and jesus has a plan  for her.      There are no other complaints, changes, or physical findings at this time.      PCP: None              Past History        Past Medical History:     Past Medical History:        Diagnosis  Date         ?  IBS (irritable bowel syndrome)             Past Surgical History:   History reviewed. No pertinent surgical history.      Family History:   History reviewed. No pertinent family history.      Social History:     Social History          Tobacco Use         ?  Smoking status:  Never Smoker     ?  Smokeless tobacco:  Never Used       Substance Use Topics         ?  Alcohol use:  Not on file         ?  Drug use:  Not on file           Allergies:     Allergies        Allergen  Reactions         ?  Penicillins  Unknown (comments)                Review of  Systems     Constitutional: Negative except as in HPI.   Eyes: Negative except as in HPI.   ENT: Negative except as in HPI.   Cardiovascular: Negative except as in HPI.   Respiratory: Negative except as in HPI.   Gastrointestinal: Negative except as  in HPI.   Genitourinary: Negative except as in HPI.   Musculoskeletal: Negative except as in HPI.   Integumentary: Negative except as in HPI.   Neurological: Negative except as in HPI.   Psychiatric: Negative except as in HPI.   Endocrine: Negative except as in HPI.   Hematologic/Lymphatic: Negative except as in HPI.   Allergic/Immunologic: Negative except as in HPI.        Physical Exam     Constitutional: Awake and alert, interactive, NAD   Eyes: PERRL, no injection or scleral icterus, no discharge   HEENT: NCAT, neck supple, MMM, no oropharyngeal exudates   CV: RRR, no m/r/g   Respiratory: CTAB, no r/r/w   GI: Abd soft, nondistended, nontender   GU: Deferred   MSK: FROM, no joint effusions or edema   Skin: No rashes   Neuro: CN2-12 intact, symmetric facies, fluent speech.   Psych: Well-groomed, normal speech, flattened affect , appropriate mood, hyperreligiosity, no SI/HI/VH, +AH, poor insight,  fair judgement.        Lab and Diagnostic Study Results        Labs -         Recent Results (from the past 12 hour(s))     EKG, 12 LEAD, INITIAL          Collection Time: 03/31/21 10:30 AM         Result  Value  Ref Range            Ventricular Rate  76  BPM       Atrial Rate  76  BPM       P-R Interval  132  ms       QRS Duration  88  ms       Q-T Interval  424  ms       QTC Calculation (Bezet)  477  ms       Calculated P Axis  22  degrees       Calculated R Axis  2  degrees       Calculated T Axis  16  degrees       Diagnosis                 Normal sinus rhythm   Normal ECG   No previous ECGs available   Confirmed by Dahlia Client, M.D., Christine ([id number]) on 03/31/2021 10:47:08 AM          CBC WITH AUTOMATED DIFF          Collection Time: 03/31/21 11:02 AM         Result  Value   Ref Range            WBC  5.4  3.6 - 11.0 K/uL       RBC  4.50  3.80 - 5.20 M/uL       HGB  11.9  11.5 - 16.0 g/dL       HCT  82.9  93.7 - 47.0 %       MCV  86.2  80.0 - 99.0 FL       MCH  26.4  26.0 - 34.0 PG       MCHC  30.7  30.0 - 36.5 g/dL       RDW  16.9 (H)  67.8 - 14.5 %       PLATELET  174  150 - 400 K/uL       NRBC  0.0  0.0 PER 100 WBC  ABSOLUTE NRBC  0.00  0.00 - 0.01 K/uL       NEUTROPHILS  69  32 - 75 %       LYMPHOCYTES  25  12 - 49 %       MONOCYTES  6  5 - 13 %       EOSINOPHILS  0  0 - 7 %       BASOPHILS  0  0 - 1 %       IMMATURE GRANULOCYTES  0  0 - 0.5 %       ABS. NEUTROPHILS  3.6  1.8 - 8.0 K/UL       ABS. LYMPHOCYTES  1.4  0.8 - 3.5 K/UL       ABS. MONOCYTES  0.3  0.0 - 1.0 K/UL       ABS. EOSINOPHILS  0.0  0.0 - 0.4 K/UL       ABS. BASOPHILS  0.0  0.0 - 0.1 K/UL       ABS. IMM. GRANS.  0.0  0.00 - 0.04 K/UL       DF  AUTOMATED          DRUG SCREEN, URINE          Collection Time: 03/31/21 11:02 AM         Result  Value  Ref Range            AMPHETAMINES  Negative  Negative         BARBITURATES  Negative  Negative         BENZODIAZEPINES  Negative  Negative         COCAINE  Negative  Negative         METHADONE  Negative  Negative         OPIATES  Negative  Negative         PCP(PHENCYCLIDINE)  Negative  Negative         THC (TH-CANNABINOL)  Negative  Negative         Drug screen comment                  This test is a screen for drugs of abuse in a medical setting only (i.e., they are unconfirmed results and as such must not be used for non-medical  purposes, e.g.,employment testing, legal testing). Due to its inherent nature, false positive (FP) and false negative (FN) results may be obtained. Therefore, if necessary for medical care, recommend confirmation of positive findings by GC/MS.       URINALYSIS W/ REFLEX CULTURE          Collection Time: 03/31/21 11:02 AM       Specimen: Urine         Result  Value  Ref Range            Color  Yellow/Straw          Appearance  Turbid (A)   Clear         Specific gravity  <1.005  1.003 - 1.030       pH (UA)  6.0  5.0 - 8.0         Protein  Negative  Negative mg/dL       Glucose  Negative  Negative mg/dL       Ketone  Negative  Negative mg/dL       Bilirubin  Negative  Negative         Blood  Negative  Negative         Urobilinogen  0.1  0.1 - 1.0 EU/dL       Nitrites  Negative  Negative         Leukocyte Esterase  Large (A)  Negative         UA:UC IF INDICATED  Urine Culture Ordered (A)  Culture not indicated by UA result         WBC  10-20  0 - 4 /hpf       RBC  0-5  0 - 5 /hpf       Bacteria  1+ (A)  Negative /hpf       Mucus  Trace  /lpf       COVID-19 WITH INFLUENZA A/B          Collection Time: 03/31/21 11:02 AM         Result  Value  Ref Range            SARS-CoV-2 by PCR  Not Detected  Not Detected         Influenza A by PCR  Not Detected  Not Detected         Influenza B by PCR  Not Detected  Not Detected         ACETAMINOPHEN          Collection Time: 03/31/21 11:02 AM         Result  Value  Ref Range            Acetaminophen level  <10 (L)  10 - 30 ug/mL       SALICYLATE          Collection Time: 03/31/21 11:02 AM         Result  Value  Ref Range            Salicylate level  2.4 (L)  2.8 - 20.0 mg/dL           Radiologic Studies -    @LASTXRRESULT @     CT Results   (Last 48 hours)          None                 CXR Results   (Last 48 hours)          None                    Medical Decision Making and ED Course     - I am the first and primary provider for this patient AND AM THE PRIMARY PROVIDER OF RECORD.      - I reviewed the vital signs, available nursing notes, past medical history, past surgical history, family history and social history.      - Initial assessment performed. The patients presenting problems have been discussed, and the staff are in agreement with the care plan formulated and outlined with them.  I have encouraged them to ask questions as they arise throughout their visit.      Vital Signs-Reviewed the patient's vital  signs.      Patient Vitals for the past 12 hrs:            Temp  Pulse  Resp  BP  SpO2            03/31/21 1017  98.3 ??F (36.8 ??C)  81  18  (!) 141/95  96 %           EKG interpretation:  NSR@76bpm , nl QRS, QTc 477 , nl axis, no STE/STD or nonanatomic TWI. No Bruagda's, hypertrophy w/dagger lateral Q-waves, delta or epsilon waves.         Provider Notes (Medical Decision Making):    44F w/dizziness after cigarette use, likely 2/2 nicotine, none now. EKG no dangerous arrhythmia. Suspect viral gastro given nausea and diarrhea. Will give zofran and reassess. Given possible command  AH, will get psych labs and have behavioral health evaluate.      ED Course:            ED Course as of 03/31/21 1505       Tue Mar 31, 2021        1459  Pending D-19 discussion with [facility] where she lives as has appt on 5/31, unclear if has crisis plan at this time. Will either be TDO and held  here or crisis plan with them, but not going home as not capable of making decisions at this time. [YA]              ED Course User Index   [YA] Arizona Constable, MD                Consultations:        Behavioral Health /Psychiatrist Dr. Lenis Noon           Disposition        Inpatient psych vs. Crisis plan w/[place].        Diagnosis        Clinical Impression:       1.  Dizziness         2.  Auditory hallucinations            Attestations:      Arizona Constable, MD

## 2021-03-31 NOTE — Progress Notes (Signed)
Spiritual Care Assessment/Progress Note  [facility]      NAME: Cynthia Soto      MRN: [id number]  AGE: 45 y.o. SEX: female  Religious Affiliation: Spirituality   Language: English     03/31/2021     Total Time (in minutes): 60     Spiritual Assessment begun in [facility] EMERGENCY DEPT through conversation with:         [x] Patient        []  Family    []  Friend(s)        Reason for Consult: Request by staff     Spiritual beliefs: (Please include comment if needed)     []  Identifies with a faith tradition:         []  Supported by a faith community:            []  Claims no spiritual orientation:           []  Seeking spiritual identity:                [x]  Adheres to an individual form of spirituality:           []  Not able to assess:                           Identified resources for coping:      [x]  Prayer                               []  Music                  []  Guided Imagery     []  Family/friends                 []  Pet visits     [x]  Devotional reading                         []  Unknown     []  Other:                                              Interventions offered during this visit: (See comments for more details)    Patient Interventions: Prayer (actual),Religious beliefs/image of God discussed,Spiritual materials provided (comment),Normalization of emotional/spiritual concerns,Affirmation of faith,Catharsis/review of pertinent events in supportive environment,Initial/Spiritual assessment, Critical care           Plan of Care:     []  Support spiritual and/or cultural needs    []  Support AMD and/or advance care planning process      []  Support grieving process   []  Coordinate Rites and/or Rituals    []  Coordination with community clergy   []  No spiritual needs identified at this time   []  Detailed Plan of Care below (See Comments)  []  Make referral to Music Therapy  []  Make referral to Pet Therapy     []  Make referral to Addiction services  []  Make referral to [facility]  []  Make referral  to Spiritual Care Partner  []  No future visits requested        [x]  Contact Spiritual Care for further referrals     Comments: Chaplain visited patient in ED 23 in response to RN referral due to patient's talk  of spirituality. Chaplain consulted with RNs prior to visit. Chaplain and patient engaged in an extended conversation regarding her search for a peace with God and assurance that Jesus loves her. Chaplain and patient engaged in reflective and empathic conversation regarding her sense of fearfulness because she has been told that she has mental illness. Patient indicated that she is aware that she sometimes has difficulty with thoughts and requested prayer for God's healing. Chaplain and patient explored her understanding of healing and chaplain encouraged patient to consider the helpfulness of medicine.  Patient stated that she has heard that from others and said that she appreciates that and will take her medication. Chaplain and patient engaged in a biblical view of examples of God's love and his love for her. Chaplain provided prayer as requested, affirming patient's Ephriam Knuckles faith and his love for her. Patient expressed an understanding of scriptures that illustrate God's love for her. Finally, patient expressed her hopefulness for a place to live and indicated she has been homeless without stating how long. Patient's engagement in the conversation with linear and logical, chaplain provided words of caring and support. Chaplain also provided a New Testament paper back bible. Advised patient to contact Spiritual Care for any further referrals.    Visited by Marvis Moeller Spreacker, BCC, MACM   Chaplain can be contacted by calling operator and requesting chaplain on call

## 2021-03-31 NOTE — ED Notes (Signed)
Constant Observer Yes - Name: Cynthia Soto   Constant Observer Oriented YES   High risk patients are in line of sight at all times Yes   Excess equipment/medical supplies not necessary for the care of the patient removed Yes   All sharp or dangerous objects are removed from room: including but not limited to belts, pens & pencils, needles, medications, cosmetics, lighters, matches, nail files, watches, necklaces, glass objects, razors, razor blades, knives, aerosol sprays, drawstring pants, shoes, cords (telephone, call bells, etc.) cleaning wipes or other cleaning items, aluminum cans, not permanently attached wall d??cor Yes   Telephone/cell phone removed as well as TV remote (batteries can be swallowed) Yes   Patient belongings removed and labeled at nurses station Yes   Excess linen is removed from room Yes   All plastic bags are removed from the room and replaced with paper trash bags Yes   Patient is in paper scrubs or appropriate gown and using hospital socks with rubber soles Yes   No metal, hard eating utensils or hard plates are on meal tray Yes   Remove all cleaning agents used by Environmental Services Yes   If Crucifix is hanging on a nail, remove Crucifix as well as the nail Yes       *If any question above is answered "No," documentation is required.

## 2021-03-31 NOTE — ED Notes (Signed)
Complains of cp and upset stomach

## 2021-03-31 NOTE — ED Notes (Signed)
 Dedra RN with [facility] have completed a preliminary search of belonging in the presence of Tiffany L Tisdel.   Personal belongings list:  Please Document in Narrator Under - Valuables    Patient placed in a green gown, personal belongings placed in belongings bag, patient label placed on the outside of the bag, and placed in a bin at nurse's station 3.   Psych safe room setup completed, ligature risk items removed/ secured,  drawers and cabinets locked.  Plan of care communicated with Annabella LITTIE Kitty.

## 2021-04-01 ENCOUNTER — Inpatient Hospital Stay

## 2021-04-01 ENCOUNTER — Inpatient Hospital Stay
Admit: 2021-04-01 | Discharge: 2021-04-10 | Disposition: A | Discharge status: Home / Self Care | Payer: MEDICAID | Attending: Psychiatry | Admitting: Psychiatry

## 2021-04-01 DIAGNOSIS — F259 Schizoaffective disorder, unspecified: Secondary | ICD-10-CM

## 2021-04-01 LAB — COMPREHENSIVE METABOLIC PANEL
ALT: 24 U/L (ref 12–78)
AST: 13 U/L — ABNORMAL LOW (ref 15–37)
Albumin/Globulin Ratio: 1 — ABNORMAL LOW (ref 1.1–2.2)
Albumin: 3 g/dL — ABNORMAL LOW (ref 3.5–5.0)
Alkaline Phosphatase: 59 U/L (ref 45–117)
Anion Gap: 5 mmol/L (ref 5–15)
BUN: 8 mg/dL (ref 6–20)
Bun/Cre Ratio: 14 (ref 12–20)
CO2: 23 mmol/L (ref 21–32)
Calcium: 9.3 mg/dL (ref 8.5–10.1)
Chloride: 110 mmol/L — ABNORMAL HIGH (ref 97–108)
Creatinine: 0.56 mg/dL (ref 0.55–1.02)
EGFR IF NonAfrican American: >60 mL/min/{1.73_m2} (ref >60)
GFR African American: >60 mL/min/{1.73_m2} (ref >60)
Globulin: 3 g/dL (ref 2.0–4.0)
Glucose: 84 mg/dL (ref 65–100)
Potassium: 3.4 mmol/L — ABNORMAL LOW (ref 3.5–5.1)
Sodium: 138 mmol/L (ref 136–145)
Total Bilirubin: 0.4 mg/dL (ref 0.2–1.0)
Total Protein: 6 g/dL — ABNORMAL LOW (ref 6.4–8.2)

## 2021-04-01 LAB — ETHYL ALCOHOL
ALCOHOL(ETHYL),SERUM: <10 mg/dL (ref <10)
Ethyl Alcohol: <10 mg/dL (ref <10)

## 2021-04-01 LAB — CULTURE, URINE
Colonies Counted: >100000
Colony Count: >100000

## 2021-04-01 LAB — HCG URINE, QL
HCG urine, QL: NEGATIVE
Pregnancy Test(Urn): NEGATIVE

## 2021-04-01 LAB — METABOLIC PANEL, COMPREHENSIVE
A-G Ratio: 1 — ABNORMAL LOW (ref 1.1–2.2)
ALT (SGPT): 24 U/L (ref 12–78)
AST (SGOT): 13 U/L — ABNORMAL LOW (ref 15–37)
Albumin: 3 g/dL — ABNORMAL LOW (ref 3.5–5.0)
Alk. phosphatase: 59 U/L (ref 45–117)
Anion gap: 5 mmol/L (ref 5–15)
BUN/Creatinine ratio: 14 (ref 12–20)
BUN: 8 mg/dL (ref 6–20)
Bilirubin, total: 0.4 mg/dL (ref 0.2–1.0)
CO2: 23 mmol/L (ref 21–32)
Calcium: 9.3 mg/dL (ref 8.5–10.1)
Chloride: 110 mmol/L — ABNORMAL HIGH (ref 97–108)
Creatinine: 0.56 mg/dL (ref 0.55–1.02)
GFR est AA: >60 mL/min/{1.73_m2} (ref >60)
GFR est non-AA: >60 mL/min/{1.73_m2} (ref >60)
Globulin: 3 g/dL (ref 2.0–4.0)
Glucose: 84 mg/dL (ref 65–100)
Potassium: 3.4 mmol/L — ABNORMAL LOW (ref 3.5–5.1)
Protein, total: 6 g/dL — ABNORMAL LOW (ref 6.4–8.2)
Sodium: 138 mmol/L (ref 136–145)

## 2021-04-01 MED ORDER — MAGNESIUM HYDROXIDE 400 MG/5 ML ORAL SUSP
400 mg/5 mL | Freq: Every day | ORAL | Status: DC | PRN
Start: 2021-04-01 — End: 2021-04-10
  Administered 2021-04-07: 02:00:00 via ORAL

## 2021-04-01 MED ORDER — NICOTINE 14 MG/24 HR DAILY PATCH
14 mg/24 hr | Freq: Every day | TRANSDERMAL | Status: DC
Start: 2021-04-01 — End: 2021-04-10

## 2021-04-01 MED ORDER — HYDROXYZINE 50 MG TAB
50 mg | Freq: Three times a day (TID) | ORAL | Status: DC | PRN
Start: 2021-04-01 — End: 2021-04-10
  Administered 2021-04-02 – 2021-04-10 (×8): via ORAL

## 2021-04-01 MED ORDER — BENZTROPINE 1 MG TAB
1 mg | Freq: Two times a day (BID) | ORAL | Status: DC | PRN
Start: 2021-04-01 — End: 2021-04-10

## 2021-04-01 MED ORDER — TRAZODONE 50 MG TAB
50 mg | Freq: Every evening | ORAL | Status: DC | PRN
Start: 2021-04-01 — End: 2021-04-10
  Administered 2021-04-08: 02:00:00 via ORAL

## 2021-04-01 MED ORDER — DIPHENHYDRAMINE HCL 50 MG/ML IJ SOLN
50 mg/mL | Freq: Two times a day (BID) | INTRAMUSCULAR | Status: DC | PRN
Start: 2021-04-01 — End: 2021-04-10

## 2021-04-01 MED ORDER — SERTRALINE 50 MG TAB
50 mg | Freq: Every day | ORAL | Status: DC
Start: 2021-04-01 — End: 2021-04-10
  Administered 2021-04-01 – 2021-04-10 (×10): via ORAL

## 2021-04-01 MED ORDER — ARIPIPRAZOLE 5 MG TAB
5 mg | Freq: Every day | ORAL | Status: DC
Start: 2021-04-01 — End: 2021-04-02
  Administered 2021-04-01 – 2021-04-02 (×2): via ORAL

## 2021-04-01 MED ORDER — OLANZAPINE 5 MG TAB, RAPID DISSOLVE
5 mg | Freq: Four times a day (QID) | ORAL | Status: DC | PRN
Start: 2021-04-01 — End: 2021-04-10

## 2021-04-01 MED ORDER — ACETAMINOPHEN 325 MG TABLET
325 mg | ORAL | Status: DC | PRN
Start: 2021-04-01 — End: 2021-04-10

## 2021-04-01 MED ORDER — PANTOPRAZOLE 40 MG TAB, DELAYED RELEASE
40 mg | Freq: Every day | ORAL | Status: DC
Start: 2021-04-01 — End: 2021-04-10
  Administered 2021-04-02 – 2021-04-10 (×9): via ORAL

## 2021-04-01 MED FILL — SERTRALINE 50 MG TAB: 50 mg | ORAL | Qty: 1

## 2021-04-01 MED FILL — NICOTINE 14 MG/24 HR DAILY PATCH: 14 mg/24 hr | TRANSDERMAL | Qty: 1

## 2021-04-01 MED FILL — ARIPIPRAZOLE 5 MG TAB: 5 mg | ORAL | Qty: 1

## 2021-04-01 NOTE — Progress Notes (Signed)
 Optimal Recovery Course  Return to top of Schizophrenia Spectrum Disorders, Adult: Inpatient Care - [facility]  Day Clinical Status Interventions Medications Evaluation Therapy   1  Clinical Indications met[I]   Social Determinants of Health Assessment   Begin discharge planning  Possible behavioral crisis management[J]   Continual observation or frequent safety checks[K]  Antipsychotic medication  Exploration of admission precipitants[L]   Psychiatric, social, medical, and substance use histories[M]   Mental status and physical examinations[N]   Symptoms assessed multiple times per shift  Psychosocial interventions emphasizing admission precipitants[O]   2  Acute symptoms improving  Possible behavioral crisis management   Continual observation or frequent safety checks  Medication review, adjustment  Evaluation completed and reviewed   Symptoms assessed multiple times per shift  Psychosocial interventions emphasizing admission precipitants[O]   Clinical management and psychoeducation[P]   Family intervention   3  No dangerous behavior for at least 24 hours[Q]  Possible behavioral crisis management   Close observation  Medication review, adjustment  Symptoms assessed multiple times per shift  Psychosocial interventions emphasizing admission precipitants[O]   Clinical management and psychoeducation[P]   Family intervention   4  No Current plan for serious Harm for at least 24 hours  No behavioral crisis management for at least 24 hours   Close observation at reduced intensity   Possible frequent safety checks  Medication review, adjustment  Symptoms assessed less often  Psychosocial interventions emphasizing barriers to discharge[O]   Clinical management and psychoeducation[P]   Family intervention   5  Functional status adequate[R]  No need for close observation  Medication review, adjustment  Transition to patient check-in with staff  Psychosocial interventions emphasizing  barriers to discharge[O]   Clinical management and psychoeducation[P]   Family intervention   6  Thoughts of suicide or Harm absent or manageable/treatable at lower level of care   Patient and supports understand follow-up treatment and crisis plan   Provider and supports sufficiently available at lower level of care   Patient can participate (eg, verify absence of plan for harm) in needed monitoring   Functional status impairment absent or adequate self-care support planned at lower level of care   Medical comorbidities, adverse medication events, and substance use absent or treatable at lower level of care   Complete discharge planning   Discharge  Review follow-up treatment and crisis plan with patient and supports[S]  Medication review, adjustment  Patient check-

## 2021-04-01 NOTE — Behavioral Health Treatment Team (Signed)
Dr. Aydin saw pt. Via Skype, no new orders.

## 2021-04-01 NOTE — Progress Notes (Signed)
UR:  [facility]  [address]  [place], [place] [address]  Phone: [phone]  Fax: [phone]  NPI: [id number]  Tax ID: [id number]  Micki Riley, MD  NPI: [id number]
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
## 2021-04-01 NOTE — Consults (Signed)
Chief Complaints:   Audiotory hallucinations       Subjective:     Cynthia Soto is a 45 y.o. female followed by None and  has a past medical history of GERD (gastroesophageal reflux disease), Hypertension, IBS (irritable bowel syndrome), Mood disorder ([place]), and Scars.  Present because continues to hear voices and she is not able to function and for this reason came to [facility] to get admitted.       Past Medical History:   Diagnosis Date   ??? GERD (gastroesophageal reflux disease)    ??? Hypertension    ??? IBS (irritable bowel syndrome)    ??? Mood disorder ([place])    ??? Scars     abdomen      Past Surgical History:   Procedure Laterality Date   ??? PR ABDOMEN SURGERY PROC UNLISTED       History reviewed. No pertinent family history.   Social History     Tobacco Use   ??? Smoking status: Current Every Day Smoker     Packs/day: 1.00   ??? Smokeless tobacco: Never Used   ??? Tobacco comment: Nicotine patch ordered   Substance Use Topics   ??? Alcohol use: Not Currently       Prior to Admission medications    Medication Sig Start Date End Date Taking? Authorizing Provider   ARIPiprazole (ABILIFY) 5 mg tablet Take 5 mg by mouth daily.    Provider, Historical   sertraline (ZOLOFT) 50 mg tablet Take 50 mg by mouth daily.    Provider, Historical     Allergies   Allergen Reactions   ??? Haldol [Haloperidol Lactate] Hives   ??? Penicillins Unknown (comments)        Review of Systems:  Review of Systems   Constitutional: Negative.    HENT: Negative.    Eyes: Negative.    Respiratory: Negative.    Cardiovascular: Negative.    Gastrointestinal: Negative.    Endocrine: Negative.    Genitourinary: Negative.    Musculoskeletal: Negative.    Skin: Negative.    Allergic/Immunologic: Negative.    Neurological: Negative.    Hematological: Negative.    Psychiatric/Behavioral: Positive for hallucinations.          Objective:     Vitals:  Visit Vitals  BP 118/81   Pulse 77   Temp 97.8 ??F (36.6 ??C)   Resp 18   SpO2 94%   Breastfeeding No       Physical  Exam:  General: Alert, cooperative, no distress.  Head:  Normocephalic, without obvious abnormality, atraumatic.  Eyes:  Conjunctivae/corneas clear. Pupils equal, round, reactive to light. Extraocular movements intact.  Lungs:  Clear to auscultation bilaterally, no wheezes, crackles  Chest wall: No tenderness or deformity.  Heart:  Regular rate and rhythm, S1, S2 normal, no murmur, click, rub, or gallop.  Abdomen:   Soft, non-tender. Bowel sounds normal. No masses. No organomegaly.  Back:  No spine tenderness to palpation  Extremities: Extremities normal, atraumatic, no cyanosis or edema.  Pulses: Symmetric all extremities.  Skin: Skin color, texture, turgor normal.   Lymph nodes: Cervical nodes normal.  Neurologic: CNII-XII intact. Normal strength, sensation, and reflexes throughout.      Labs:  Recent Results (from the past 24 hour(s))   ETHYL ALCOHOL    Collection Time: 04/01/21  1:07 AM   Result Value Ref Range    ALCOHOL(ETHYL),SERUM <10 <78 mg/dL   METABOLIC PANEL, COMPREHENSIVE    Collection Time: 04/01/21  1:07  AM   Result Value Ref Range    Sodium 138 136 - 145 mmol/L    Potassium 3.4 (L) 3.5 - 5.1 mmol/L    Chloride 110 (H) 97 - 108 mmol/L    CO2 23 21 - 32 mmol/L    Anion gap 5 5 - 15 mmol/L    Glucose 84 65 - 100 mg/dL    BUN 8 6 - 20 mg/dL    Creatinine 0.56 0.55 - 1.02 mg/dL    BUN/Creatinine ratio 14 12 - 20      GFR est AA >60 >60 ml/min/1.3m    GFR est non-AA >60 >60 ml/min/1.721m   Calcium 9.3 8.5 - 10.1 mg/dL    Bilirubin, total 0.4 0.2 - 1.0 mg/dL    AST (SGOT) 13 (L) 15 - 37 U/L    ALT (SGPT) 24 12 - 78 U/L    Alk. phosphatase 59 45 - 117 U/L    Protein, total 6.0 (L) 6.4 - 8.2 g/dL    Albumin 3.0 (L) 3.5 - 5.0 g/dL    Globulin 3.0 2.0 - 4.0 g/dL    A-G Ratio 1.0 (L) 1.1 - 2.2         Imaging:  No results found.     Assessment & Plan:     Auditory hallucinations  - management as per primary pshiatry team     GERD  - continue home PPI     IBS  - has issues with diarhea and constipation   -  monitor closely     Overactive bladder  - restart home medicatios     Thank you for allowing [facility] help manage your patient please call with any questions or concerns     Spent 30 minutes to evaluate and coordinate consult for BHPimaatient for medical and neurological evaluation requested by Dr. AyDanielle Rankin      Electronically signed by GoDomingo DimesMD on 04/01/2021 at 6:04 PM

## 2021-04-01 NOTE — Progress Notes (Signed)
Problem: Depressed Mood (Adult/Pediatric)  Goal: *STG: Participates in treatment plan  Outcome: Progressing Towards Goal  Goal: *STG: Verbalizes anger, guilt, and other feelings in a constructive manor  Outcome: Progressing Towards Goal  Goal: *STG: Attends activities and groups  Outcome: Progressing Towards Goal  Goal: *STG: Remains safe in hospital  Outcome: Progressing Towards Goal  Goal: *STG: Complies with medication therapy  Outcome: Progressing Towards Goal  Goal: Interventions  Outcome: Progressing Towards Goal

## 2021-04-01 NOTE — Progress Notes (Signed)
 GMLOS : 5 days  Schizophrenia Spectrum Disorders, Adult: Inpatient Care - Clinical Indications for Admission to Inpatient Care by Shery Nest, RN        Met: Reviewed on 04/01/2021 by Shery Nest, RN      Created Using Review Status Review Entered   Indicia Completed 04/01/2021 10:55      Criteria Set Name - Subset   Schizophrenia Spectrum Disorders, Adult: Inpatient Care - Clinical Indications for Admission to Inpatient Care     Criteria Review      Clinical Indications for Admission to Inpatient Care    Most Recent Editor: Shery Nest Most Recent Date: 04/01/2021 10:55:59 EDT    (X) Admission to Inpatient Level of Care for Adult is indicated due to ALL of the following    [A] [B]:     (X) Patient risk or severity of behavioral health disorder is appropriate to proposed level of     care, as indicated by 1 or more of the following (1) (2) (3) (4) (5) (6):      (X) Imminent danger to self for adult      ( ) Behavioral health disorder is present and appropriate for inpatient care with ALL of the      following :        (X) Severe Psychiatric, behavioral, or other comorbid conditions for adult [C] [D] [E]     (X) Treatment services available at proposed level of care are necessary to meet patient needs     and 1 or more of the following [F]:      (X) Specific condition related to admission diagnosis is present and judged likely to further      improve at proposed level of care.      (X) Specific condition related to admission diagnosis is present and judged likely to deteriorate      in absence of treatment at proposed level of care.     (X) Situation and expectations are appropriate for inpatient care for adult, as indicated by      1 or more of the following (1) (2) (5) (12) (13):      (X) Voluntary treatment at lower level of care is not feasible (eg, very short-term crisis intervention      or residential care unavailable  or unacceptable for patient condition).

## 2021-04-01 NOTE — Behavioral Health Treatment Team (Signed)
Upon pt. Speaking with Clinical research associate in private, pt. States she does not feel comfortable having someone sleep alone in her room with her right now, she is afraid of others except staff and even some of them until she gets to know them as she may get hurt.  Pt. States she is okay with female staff on the floor taking care of everyone, but not in her room alone at anytime.  Information has been passed along to all staff, including physicians, and female staff with accompany any female staff to evaluate patient in private.  Dr. Has Blocked pt's room at this time, assuring pt's safety and concerns.

## 2021-04-01 NOTE — Behavioral Health Treatment Team (Signed)
Pt. Told Dr. Hazle Quant, she has a shunt in her head, to make sure the fluid  Drains from her brain,for medical history. Pt had requested writer be in room with her during evaluation.  Pt. States she felt more comfortable, with having writer there.

## 2021-04-01 NOTE — Behavioral Health Treatment Team (Signed)
Hopsitalist, Dr. Hazle Quant, completed pt's initial medical evaluation.

## 2021-04-01 NOTE — Behavioral Health Treatment Team (Signed)
 Pt. Arrived on unit at 0645 in her own clothes from [facility], Voluntarily for auditory Hallucinations. Pt. Has a hx. Of Schizoaffective bipolar type.  Pt. States she is homeless, just got out of [facility], but she still hears voices. Pt. Denies SI/HI, states she feels her medications might just need adjusted.  Pt. smokes 1/2 pack cigarettes a day, nicotine patch ordered. Pt. Does not use street drugs, does not drink alcohol, has not had the COVID injections, and does not want, did not have her Flu Vaccine, refuses. Handbook explained, signed for and copy given to patient. Belongings searched,  Documented and locked up, no contraband found.  Skin assessment completed, no tattoos, scar on abdomen. Pt. Has a hx. Of HTN, GERD, and IBS, with no special diet. Unit tour given with orientation to rules, peers, and staff. q 15 minute safety checks initiated upon arrival to unit and continue. Dr. Rigoberto and Hospitalist aware of need for evaluations.

## 2021-04-02 MED ORDER — ARIPIPRAZOLE 10 MG TAB
10 mg | Freq: Every day | ORAL | Status: DC
Start: 2021-04-02 — End: 2021-04-10
  Administered 2021-04-03 – 2021-04-10 (×8): via ORAL

## 2021-04-02 MED FILL — PANTOPRAZOLE 40 MG TAB, DELAYED RELEASE: 40 mg | ORAL | Qty: 1

## 2021-04-02 MED FILL — ARIPIPRAZOLE 5 MG TAB: 5 mg | ORAL | Qty: 1

## 2021-04-02 MED FILL — TRAZODONE 50 MG TAB: 50 mg | ORAL | Qty: 1

## 2021-04-02 MED FILL — SERTRALINE 50 MG TAB: 50 mg | ORAL | Qty: 1

## 2021-04-02 MED FILL — HYDROXYZINE 50 MG TAB: 50 mg | ORAL | Qty: 1

## 2021-04-02 MED FILL — NICOTINE 14 MG/24 HR DAILY PATCH: 14 mg/24 hr | TRANSDERMAL | Qty: 1

## 2021-04-02 NOTE — Behavioral Health Treatment Team (Signed)
PSA PART II ADDITIONAL INFORMATION        Access To Fire Arms: No    Substance Use: NO    Last Use: N/A    Type of Substance: no substance use    Frequency of Use: N/A    Request to See Chaplain: YES    If yes, notified Chaplain: YES    Guardian:NO    Guardian Contact: N/A    Release of Information Signed: YES    Release of Information Signed For: [place], [place] [facility]    Patient signed treatment plan.    Writer called and left a message for pt's case Production designer, theatre/television/film, [employer] with [employer].     Writer spoke with Marcellus Scott, [facility] case Production designer, theatre/television/film. Ms. Gayla Doss stated that pt was living with a friend and then a boyfriend. Ms. Gayla Doss stated that pt was staying at [facility]. Ms. Gayla Doss stated that she was looking into placement for pt and agrees that pt needs supervision and could benefit from placement into a group home.

## 2021-04-02 NOTE — Behavioral Health Treatment Team (Signed)
Pt slept overnight ??,remained sleeping as of this time, breathing spontaneously and changing positions while asleep.. No violent no self harming behaviors noticed ??or reported

## 2021-04-02 NOTE — Progress Notes (Signed)
Patient is slowly progressing toward goals

## 2021-04-02 NOTE — Progress Notes (Signed)
 Problem: Depressed Mood (Adult/Pediatric)  Goal: *STG: Participates in treatment plan  Outcome: Progressing Towards Goal  Note: Patient was able to develop an overall goal during PSA stating To get all I can out of treatment.

## 2021-04-02 NOTE — Behavioral Health Treatment Team (Signed)
 Pt received in activity room socializing with others .    Pt upon assessment  Alert and oriented x4  Denies SI/HI, reported at time hearing voices, and using  payer  to take that devil away the voices also she said she don't like negative things and  I have been told to socialize with others but not every talk I like  I don't like negative things.    also repotted she  went to [facility] to fix her medication and was discharged to Motel room as she has no place to go I am homeless.Pt encouraged to discuss this with social work and was assured she will not be released to street, and will be released to safe place.    cooperative with assessment, no pain complaints.  She rated depression as 5 and anxiety as 5 , also described her mood as  scared, and she like her room light to stay on while she sleeps at night.    At  2125  requested and given po prn Atarax 50 mg for anxiety , helpful she start sleeping by 2145 .    Remained sleeping as of this time.      No violent no self harming behavior noticed or reported.

## 2021-04-02 NOTE — H&P (Signed)
Initial psychiatric evaluation    Chief complaint  Hearing voices, depressed mood, confused, paranoid ideations, psychotic outlook    History of present illness  45 year old female with history of schizoaffective disorder presented to the emergency room appearing confused and lacking capacity.  She was recently discharged from [facility].  She is hearing voices telling her to do things.  She states that she went based on what Would want to do and tried to flush her thoughts and medicines out.  She reported some interest in getting back on her medications.  She has been religiously preoccupied.  She admits to hearing voices and paranoid ideations.  At times she is responding to internal stimuli.  She has been having hard time thinking clearly and at times is confused.  Denies recent substance abuse problems.  She sees a Museum/gallery exhibitions officer Dr. Allena Katz.  She has been depressed and also hearing voices and paranoid ideations.  She is acutely psychotic and unable to take care of herself at this current status.  She is hearing voices and paranoid ideations and not able to think clearly.    Past psychiatric history  She has history of schizoaffective disorder  She is recently hospitalized at [facility] and discharged and has not taking her medications  She has follow-up by [facility]    Family psychiatric history  There are blood relatives with mental health problems  Father had problems with alcoholism    Current medications  Zoloft 50 mg daily and Abilify 5 mg daily but she has not been taking    Social history  She has limited supports  Recently homeless  Says she has 2 children ages 50 and 93 but we are not sure  Please also see psychosocial assessment    Review of systems and physical examination  Please see medical H&P in chart    Mental status exam  Alert oriented cooperative  Visibly confused paranoid  Has difficulty thinking clearly  Speech is at times and mumbled and  tangential  Thought process tangential and illogical at times  Positive hearing voices  Appears to be responding to internal stimuli at times  Psychotic outlook  Also appears depressed withdrawn    Diagnosis  Schizoaffective disorder    Recommendations  Patient is appropriate for psychiatric hospitalization which is medically necessary  I will increase Abilify to 10 mg daily  Further adjustments in the medications may be needed  Also would benefit from psychosocial interventions  Follow-up with psychiatric and tomorrow

## 2021-04-02 NOTE — Progress Notes (Signed)
Spiritual Care Assessment/Progress Note  [facility]      NAME: Cynthia Soto      MRN: [id number]  AGE: 45 y.o. SEX: female  Religious Affiliation: Spirituality   Language: English     04/02/2021     Total Time (in minutes): 75     Spiritual Assessment begun in [facility] through conversation with:         [x] Patient        []  Family    []  Friend(s)        Reason for Consult: Request by staff,Request by patient,Initial/Spiritual assessment, patient floor     Spiritual beliefs: (Please include comment if needed)     [x]  Identifies with a faith tradition: Christian        []  Supported by a faith community:            []  Claims no spiritual orientation:           []  Seeking spiritual identity:                []  Adheres to an individual form of spirituality:           []  Not able to assess:                           Identified resources for coping:      [x]  Prayer                               []  Music                  []  Guided Imagery     []  Family/friends                 []  Pet visits     []  Devotional reading                         []  Unknown     []  Other:                                              Interventions offered during this visit: (See comments for more details)    Patient Interventions: Affirmation of emotions/emotional suffering,Affirmation of faith,Catharsis/review of pertinent events in supportive environment,Coping skills reviewed/reinforced,Iconic (affirming the presence of God/Higher Power),Normalization of emotional/spiritual concerns,Prayer (assurance of),Religious beliefs/image of God discussed           Plan of Care:     [x]  Support spiritual and/or cultural needs    []  Support AMD and/or advance care planning process      []  Support grieving process   []  Coordinate Rites and/or Rituals    []  Coordination with community clergy   []  No spiritual needs identified at this time   []  Detailed Plan of Care below (See Comments)  []  Make  referral to Music Therapy  []  Make referral to Pet Therapy     []  Make referral to Addiction services  []  Make referral to [facility] Passages  []  Make referral to Spiritual Care Partner  []  No future visits requested        [x]  Contact Spiritual Care for further referrals     Comments: Discussed  case with staff while rounding in the Behavioral Health Unit. Visited with patient for a lengthy period of time. Patient shared her beliefs as a [place], and wanting to be ordained and share God's word with others. Patient shared that she would also like to be reconnected with her two children at some point who are currently in the foster care system she believes. Patient expressed concern about other patients and discerning that their spirits were not right. Patient values and appreciates prayer, sharing that she felt better and lighter. Chaplain provided words of encouragement, care, and concern and prayer. Advised of chaplain availability.   Advised nurse to contact Spiritual Care for any further referrals.  Rev. LaToya [place], MDiv, Chaplain
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
## 2021-04-02 NOTE — Behavioral Health Treatment Team (Signed)
 B: Patient up at the change of shift. Alert and oriented. Patient became very anxious because the lab wanted to draw her blood from her hand rather than the Antecubital area on her arm. Reassured patient that she did not have to have her lab work drawn at this time. The lab would have another lab tech come down later on to see if she could find a different site to have blood work drawn from. Patient worried that she is doing something wrong but contiuned to provide reassurance. Patient remains religiously preoccupied. Stated well we have to leave in the hands of Jesus. Patient admits to still hearing voices that tell her do bad things. Patient however then stated that she felt her medications were helping her. States she is sleeping and eating well and attends groups. Patient does not like to interact with other peers. Patient when given direction such as to drink water to help with hydration will take it tho the extreme and ask for several cups of water until she is told that she does not have to drink anymore unless she wants. Patient has mild ID and lets people take advantage of her. Stated I just want someone to love me. Patient is worried that she is homeless and is not sure what is going to happen to her when she is ready to leave. Patient continues to rate her depression and anxiety at 5 Denies SI/HI ideations.   I: Provide medications as ordered. Provide ways patient can help decrease her anxiety. Monitor every 15 minutes for safety.  R: Patient is complaint with medications. She has been instructed on importance of not smoking. Patient is wearing Nicotine patch. Patient needs more teaching in regards to her medications. Question whether patient can safely give,her own medications. Will discuss the above concenrs in treatment team today.   P: Treatment Team Today.Discuss possible plans for placement upon discharge.

## 2021-04-02 NOTE — Behavioral Health Treatment Team (Signed)
Treatment Team with patient, Dr. Donalynn Furlong, Serena Croissant, SW, Iona Hansen, RN Unit Manager, and Tyler Pita, Consulting civil engineer.  Patient was admitted 04/01/21 for AH, dizziness.  Hx of being off of her meds for at least a month, Bipolar, schizophrenia, spine surgery, SI, GERD, anxiety, MDD, and C/o AH.  Misses Mom and oldest son both of whom passed 2 months ago.  Hx Bipolar, schizophrenia, anxiety, MDD.  Patient is homeless .Patient verbalized understanding.  Will focus today on Group sessions and treatment team.

## 2021-04-02 NOTE — Progress Notes (Signed)
Problem: Depressed Mood (Adult/Pediatric)  Goal: *STG: Participates in treatment plan  Outcome: Progressing Towards Goal  Goal: *STG: Verbalizes anger, guilt, and other feelings in a constructive manor  Outcome: Progressing Towards Goal  Goal: *STG: Attends activities and groups  Outcome: Progressing Towards Goal  Goal: *STG: Remains safe in hospital  Outcome: Progressing Towards Goal  Goal: *STG: Complies with medication therapy  Outcome: Progressing Towards Goal  Goal: Interventions  Outcome: Progressing Towards Goal

## 2021-04-03 MED ORDER — CEPHALEXIN 250 MG CAP
250 mg | Freq: Four times a day (QID) | ORAL | Status: AC
Start: 2021-04-03 — End: 2021-04-06
  Administered 2021-04-03 – 2021-04-06 (×12): via ORAL

## 2021-04-03 MED FILL — ARIPIPRAZOLE 10 MG TAB: 10 mg | ORAL | Qty: 1

## 2021-04-03 MED FILL — CEPHALEXIN 250 MG CAP: 250 mg | ORAL | Qty: 2

## 2021-04-03 MED FILL — SERTRALINE 50 MG TAB: 50 mg | ORAL | Qty: 1

## 2021-04-03 MED FILL — HYDROXYZINE 50 MG TAB: 50 mg | ORAL | Qty: 1

## 2021-04-03 MED FILL — NICOTINE 14 MG/24 HR DAILY PATCH: 14 mg/24 hr | TRANSDERMAL | Qty: 1

## 2021-04-03 MED FILL — PANTOPRAZOLE 40 MG TAB, DELAYED RELEASE: 40 mg | ORAL | Qty: 1

## 2021-04-03 NOTE — Progress Notes (Signed)
Visited with patient while in the Behavioral Health Unit at patient request. Patient shared quite a few concerns, and we talked through each. Chaplain provided patient with words of encouragement, affirmation, empowerment, and prayer. Advised of chaplain availability. Rev. LaToya [place], MDiv, Chaplain

## 2021-04-03 NOTE — Progress Notes (Signed)
Was notified that UA collected on 5/24 shows 1+ bacteria and moderate leukocytes and so will start keflex 500mg  oral QID x 3 days. Patient has pcn allergy but patient says she has taken keflex in past and tolerated well.

## 2021-04-03 NOTE — Behavioral Health Treatment Team (Signed)
Pt received in activity room socializing with others .    Pt upon assessment  Alert and oriented x4  Denies SI/HI, No voices toghit per pt,, smiling, more active     cooperative with assessment, no pain complaints.    She attended wrap up group and ate snack.    She rated depression as1and anxiety as 1, also described her mood , she was happy about  being visited by [facility] Care staff day shift and doing prayer,.     at [employer] time she asked that Not to give in formation abut her to her Ex husband which she has protective order as she said she identified him as" [employer]"  Pt assured that staff will not give information  To  any body without  her permission, also I asked her if he try to contact her or calling her here  Or if he know that she is here at this placeshe denied  If he is aware.     At  2109  given po prn Atarax 50 mg for anxiety/to help with sleep , helpful she start sleeping by 2205.    she like her room light to stay on while she sleeps at night.    Pt sleeping as of this time.      No violent no self harming behavior noticed or reported.

## 2021-04-03 NOTE — Behavioral Health Treatment Team (Signed)
B: Pt has been walking in hall this evening and has been in and out of Group Room. Came to nurse's station and said she wanted to be discharged tomorrow because nobody liked her and the other patients disliked her being in the group room. When asked if anyone had said this to her she stated " no, she could feel it. Pt reassured that she is welcome at all groups and activities. States   She is upset about the TV shows being "disrespectful to the [place]". Denies suicidal and homicidal thoughts. Compliant with meds, safe on unit.  I: Maintain a safe environment for pt, safety cks q 15 mins and prn. Give meds as ordered, encourage groups.  R: Pt is anxious during assessment but calmed down and said he felt better as we talked. Denies suicidal and homicidal thoughts. Religiously preoccupied throughout assessment and somewhat paranoid about about peers. Safe on unit.  P: Continue to monitor, give meds as ordered, encourage groups.

## 2021-04-03 NOTE — Progress Notes (Signed)
Pt is progressing toward her goals. Denies suicidal and homicidal thoughts. States she wants to go home because people here don't like her. States she feels anxious and depressed at times because the shows on TV are disrespectful to the [place]. Pt is reassured and asked to participate in groups and talk with staff 1:1. Compliant with meds, safe on unit.

## 2021-04-03 NOTE — Behavioral Health Treatment Team (Signed)
Behavioral Health Treatment Team Note     Patient goal(s) for today: Pt reports "Talk to the doctor and you about discharge."  Treatment team focus/goals: medication management, safe discharge planning, attend groups daily    Progress note: Pt was observed sitting on the phone and talking. Pt reports that she was talking to her friend. Pt reports that her friend stated that she could not stay with her after discharge. Writer processed with pt about the possibility of living in a group setting. Pt stated "Not for me." Pt stated that she wanted to continue working with her case manager in [place] to work on housing. Pt stated that she wants to live on her own and would be willing to work with a care team. Pt reports that she is feeling better today stating "I'm not hearing any voices." Pt denies any SI, HI, AVH. Pt was oriented x4. An inpatient level of care is needed in order to coordinate a safe discharge plan and to stabilize the pt further on medications.     Pt approached Clinical research associate later in the day and stated that she was unsure if [facility] was a safe place for her to return to. Pt stated that her ex-husband was looking for her and that she does not believe she should return there.     LOS:  2  Expected LOS: 5-7 days    Insurance info/prescription coverage:  [facility] [employer]  Date of last family contact:  Clinical research associate left a message for pt's case Production designer, theatre/television/film at [employer]  Family requesting physician contact today:  no  Discharge plan:  Recommended group home  Guns in the home:  no   Outpatient provider(s):  [facility]    Participating treatment team members: Renella Cunas, * (assigned SW), [employer], LPC

## 2021-04-03 NOTE — Behavioral Health Treatment Team (Signed)
Pt slept overnight????,remained sleeping as of this time, breathing spontaneously and changing positions while asleep.. No violent no self harming behaviors noticed ??or reported

## 2021-04-03 NOTE — Behavioral Health Treatment Team (Signed)
B:  Pt. Alert and oriented x 3.  Dull, blunted affect.  Answers questions appropriately.  Hyperreligious perseveration noted.  Pt. States depression is a 3.  Pt. States anxiety is 4.  Pt. denies hallucinations.  Denies SI/HI.  PERRLA.  Speech clear.  Thyroid midline.  Lungs clear, no cough.  Heart RRR, no murmur.  Abdomen softly distended, mild diffuse tenderness on light palpation; bs + all quads.  Per pt, voids dark yellow urine; denies pain or difficulty with urination.  Pt has started taking her antibiotic per MD order for UTI.  Peripheral pulses 2+ and equal bilaterally.  Ambulates with a steady gait unassisted.  No complaints with assessment.   I:   Administer medications as ordered and needed, Encourage pt to attend and participate in groups, encourage pt to be up for all meals and snacks, consuming all each time, encourage pt to interact with peers in a positive manner. Q 15 minute safety checks continue.  R:   Compliant with medications.  does attend groups, does participate.  Pt. is getting up for meals, consumes 100% of meals, snacks. Pt. does interact with peers. no safety concerns at this time.  P:   Pt. Will develop and continue to utilize positive coping skills. Pt will verbalize anger, guilt, and other feelings in a constructive manor.  Pt will understand illness and identify signs of relapse.

## 2021-04-04 MED FILL — CEPHALEXIN 250 MG CAP: 250 mg | ORAL | Qty: 2

## 2021-04-04 MED FILL — PANTOPRAZOLE 40 MG TAB, DELAYED RELEASE: 40 mg | ORAL | Qty: 1

## 2021-04-04 MED FILL — ARIPIPRAZOLE 10 MG TAB: 10 mg | ORAL | Qty: 1

## 2021-04-04 MED FILL — NICOTINE 14 MG/24 HR DAILY PATCH: 14 mg/24 hr | TRANSDERMAL | Qty: 1

## 2021-04-04 MED FILL — SERTRALINE 50 MG TAB: 50 mg | ORAL | Qty: 1

## 2021-04-04 NOTE — Progress Notes (Signed)
Pt is progressing toward her goals. Denies suicidal and homicidal thoughts, states she is feeling less anxious and depressed today and looking forward to going home soon. Interacting with staff and peers this evening, compliant with meds, safe on unit.

## 2021-04-04 NOTE — Behavioral Health Treatment Team (Signed)
B:  Pt. Alert and oriented x 3.  Pt. States depression is a 4.  Pt. States anxiety is 2.  Pt. denies hallucinations.  Denies SI/HI.  No complaints with assessment.  PERRLA.  Affect blunted.  Thyroid midline.  No acute distress noted.  Lungs clear to auscultation.  No cough.  Heart RRR, no murmur.  Abdomen softly distended, nontender; bs + all quads.  Denies constipation.  Per pt, voids yellow urine without pain or difficulty.  Peripheral pulses 2+ and equal bilaterally.  Ambulatory with steady gait.     I:   Administer medications as ordered and needed, Encourage pt to attend and participate in groups, encourage pt to be up for all meals and snacks, consuming all each time, encourage pt to interact with peers in a positive manner. Q 15 minute safety checks continue.  R:   Compliant with medications.  does attend groups, does participate.  Pt. is getting up for meals, consumes 100% of meals, snacks. Pt. does interact with peers. no safety concerns at this time.  P:   Pt. Will develop and continue to utilize positive coping skills. Pt will be patient with regard to placement.  Per notes in chart, no placement found at this time.  Will continue to monitor for safety q15 min per unit protocol.

## 2021-04-04 NOTE — Behavioral Health Treatment Team (Signed)
Pt rested well tonight with no complaints voiced and no problems noted on rounds. Safe on unit will continue to monitor.

## 2021-04-04 NOTE — Behavioral Health Treatment Team (Signed)
B: Pt has been out of her room this evening in Group Room interacting well with staff and peers. Denies suicidal and homicidal thoughts, says her depression and anxiety is better today. Talked with family at phone time and tolerated call well. Sates she is ready to go home. Compliant with meds, eating and sleeping well, safe on unit.  I: Maintain a safe environment for pt, safety cks q 15 mins and prn. Give meds as ordered, encourage groups.  R: Pt is cooperative with assessment, states she is ready to go home. Denies suicidal and homicidal thoughts. Says her depression and anxiety is better. Continues to have some paranoia regarding peers. Encouraged to attend groups, safe on unit.  P: Continue to monitor, give meds as ordered, encourage groups.

## 2021-04-05 MED FILL — NICOTINE 14 MG/24 HR DAILY PATCH: 14 mg/24 hr | TRANSDERMAL | Qty: 1

## 2021-04-05 MED FILL — SERTRALINE 50 MG TAB: 50 mg | ORAL | Qty: 1

## 2021-04-05 MED FILL — CEPHALEXIN 250 MG CAP: 250 mg | ORAL | Qty: 2

## 2021-04-05 MED FILL — HYDROXYZINE 50 MG TAB: 50 mg | ORAL | Qty: 1

## 2021-04-05 MED FILL — TRAZODONE 50 MG TAB: 50 mg | ORAL | Qty: 1

## 2021-04-05 MED FILL — ARIPIPRAZOLE 10 MG TAB: 10 mg | ORAL | Qty: 1

## 2021-04-05 MED FILL — PANTOPRAZOLE 40 MG TAB, DELAYED RELEASE: 40 mg | ORAL | Qty: 1

## 2021-04-05 NOTE — Progress Notes (Signed)
Pt is progressing toward her goals. Denies suicidal and homicidal thoughts, says she is not feeling depressed now. Can get anxious and fearful at times and needs reassurance from staff. Eating and sleeping well, compliant with meds, safe on unit.

## 2021-04-05 NOTE — Behavioral Health Treatment Team (Signed)
Pt slept well tonight after receiving PRN Atarax for anxiety. No complaints voiced and no problems noted on rounds, safe on unit.

## 2021-04-05 NOTE — Behavioral Health Treatment Team (Signed)
Pt. Alert and oriented x 3. Physical Assessment was done et wnl. No c/o pain voiced. Pt. Calm et cooperative.  Pt. States depression is a 0/10.  Pt. States anxiety is 2/10.   Pt. Denies having  hallucinations. Pt. Denies SI/HI.        I: Administer medications as ordered and needed, Encourage pt to attend and participate in groups, encourage pt to be up for all meals and snacks, consuming all each time, encourage pt to interact with peers in a positive manner. Q 15 minute safety checks continue.  R: Pt. Is compliant  with medications.  does attend groups, does participate.  Pt. Is  getting up for meals, consumes 75% of meals, snacks. Pt. does interact with peers. no safety concerns at this time.  P:Will cont. To monitor q33min. Checks for safety. Administer medications as ordered et as needed. Will cont. Encourage group attendance et participation.

## 2021-04-05 NOTE — Behavioral Health Treatment Team (Signed)
B: Pt has been out of her room this evening in Group Room interacting well with staff and peers. Denies suicidal and homicidal thoughts, says her depression and anxiety is better today Sates she is ready to go home. Compliant with meds, eating and sleeping well. Can get anxious and fearful at times on unit and needs reassurance from staff, encouraged to work on her coping skills. Safe on unit.   I: Maintain a safe environment for pt, safety cks q 15 mins and prn. Give meds as ordered, encourage groups.   R: Pt is cooperative with assessment, states she is ready to go home. Denies suicidal and homicidal thoughts. Says her depression and anxiety is better. Continues to have some paranoia regarding peers. Encouraged to attend groups, safe on unit.   P: Continue to monitor, give meds as ordered, encourage groups.

## 2021-04-06 MED FILL — HYDROXYZINE 50 MG TAB: 50 mg | ORAL | Qty: 1

## 2021-04-06 MED FILL — CEPHALEXIN 250 MG CAP: 250 mg | ORAL | Qty: 2

## 2021-04-06 MED FILL — PANTOPRAZOLE 40 MG TAB, DELAYED RELEASE: 40 mg | ORAL | Qty: 1

## 2021-04-06 MED FILL — ARIPIPRAZOLE 10 MG TAB: 10 mg | ORAL | Qty: 1

## 2021-04-06 MED FILL — SERTRALINE 50 MG TAB: 50 mg | ORAL | Qty: 1

## 2021-04-06 MED FILL — NICOTINE 14 MG/24 HR DAILY PATCH: 14 mg/24 hr | TRANSDERMAL | Qty: 1

## 2021-04-06 NOTE — Behavioral Health Treatment Team (Signed)
 B: Pt is alert and oriented x4. Pt is cooperative with assessments.  Pt reports feeling okay.  Pt identifies they're doing better since their admission.  Pt is able to identify personal coping alternatives: stay focused positive, mind on things above, pray, make silk flower arrangements.  No s/s of distress noted.  No complaints of pain.   I: Assess Pt mood.  Document presence of depressive/anxiety symptoms.  Assess for Audio/visual hallucinations.  Establish trust.  Educate Pt on medications and disease processes.  Monitor ADLs, food/fluid consumption and sleep.  Encouarge group participation and socialization.  Educate Pt on medications, coping alternatives, disease processes, and community resources.  Safety checks.  R: Pt mood is stable. Pt rates depression 0/10, identifies triggers as none.  Pt rates anxiety at 5/10, states this is a little better than her norm.  Pt denies SI.  Pt denies HI.  Pt denies audio/visual hallucinations, s/s are not observed by staff.  Pt reports that she had been living in a motel in [place], and will need help on discharge.  Pt states she may receive services from [facility], and will need help managing her medications.  Pt is able to identify most of her medications by name and their purposes, but has difficulty at home.  Pt reports she does not like taking pills.  Pt previously has been on the Abilify long-acting-injection (LAI), however reports a bump on her arm resulted.  Pt expresses interest in the LAI, as this would be easier to keep up with. Pt is attending groups and is interacting appropriately with staff/peers.  Pt is eating all meals, and is maintaining their personal hygiene.  Pt reports sleeping not so well, tossed & turned.  Pt is compliant wiith medications.  P: Encourage group participation and socialization.

## 2021-04-06 NOTE — Behavioral Health Treatment Team (Signed)
Pt slept well tonight with no complaints v oiced and no problems noted on rounds. Safe on unit will continue to monitor.
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
## 2021-04-07 MED ORDER — CLONIDINE 0.1 MG TAB
0.1 mg | Freq: Two times a day (BID) | ORAL | Status: DC
Start: 2021-04-07 — End: 2021-04-10
  Administered 2021-04-07 – 2021-04-10 (×7): via ORAL

## 2021-04-07 MED FILL — MAGNESIUM HYDROXIDE 400 MG/5 ML ORAL SUSP: 400 mg/5 mL | ORAL | Qty: 30

## 2021-04-07 MED FILL — NICOTINE 14 MG/24 HR DAILY PATCH: 14 mg/24 hr | TRANSDERMAL | Qty: 1

## 2021-04-07 MED FILL — ARIPIPRAZOLE 10 MG TAB: 10 mg | ORAL | Qty: 1

## 2021-04-07 MED FILL — CLONIDINE 0.1 MG TAB: 0.1 mg | ORAL | Qty: 1

## 2021-04-07 MED FILL — PANTOPRAZOLE 40 MG TAB, DELAYED RELEASE: 40 mg | ORAL | Qty: 1

## 2021-04-07 MED FILL — HYDROXYZINE 50 MG TAB: 50 mg | ORAL | Qty: 1

## 2021-04-07 MED FILL — SERTRALINE 50 MG TAB: 50 mg | ORAL | Qty: 1

## 2021-04-07 NOTE — Progress Notes (Signed)
Problem: Depressed Mood (Adult/Pediatric)  Goal: *STG: Verbalizes anger, guilt, and other feelings in a constructive manor  Outcome: Progressing Towards Goal  Goal: *STG: Attends activities and groups  Outcome: Progressing Towards Goal  Goal: *STG: Remains safe in hospital  Outcome: Progressing Towards Goal  Goal: *STG: Complies with medication therapy  Outcome: Progressing Towards Goal  Goal: Interventions  Outcome: Progressing Towards Goal

## 2021-04-07 NOTE — Behavioral Health Treatment Team (Signed)
Pt slept overnight,remained sleeping as of this time, breathing spontaneously and changing positions while asleep.Marland Kitchen No violent no self harming behaviors noticed or reported
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
## 2021-04-07 NOTE — Behavioral Health Treatment Team (Signed)
Pt. Interacting with peers, groups, meals, pleasant, good eye contact, clear thoughts and verbalization.  Pt. States she is not focusing on the past situations as negative, she is taking them as learning experiences for the positive.  Pt. States she has done a lot of thinking and looking back at past situations since she has been in here, and definitely sees things she did wrong as well. Pt. States she feels like she is ready to go home soon, and start things on her own.  No safety concerns. q 15 minute safety checks continue.

## 2021-04-07 NOTE — Behavioral Health Treatment Team (Signed)
Dr. Azucena Fallen with pt, New order received for Clonidine 0.1 BID For nightmares, and discussed upcoming discharge placement.  Social Worker aware.
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
## 2021-04-07 NOTE — Behavioral Health Treatment Team (Signed)
B:  Pt. Alert and oriented x 4.  Pt. States depression is a 0.  Pt. States anxiety is 0.  Pt. Denies hallucinations.  Denies SI/HI.  Cooperative with assessment. Pt smiling, good eye contact, states she is not  Sure where she is going when she leaves, but she knows she feels a lot better now, safer.   I:   Administer medications as ordered and needed, Encourage pt to attend and participate in groups, encourage pt to be up for all meals and snacks, consuming all each time, encourage pt to interact with peers in a positive manner. Q 15 minute safety checks continue.  R:   Compliant with medications.  does attend groups, does participate.  Pt. is getting up for meals, consumes all of meals, snacks. Pt. does interact with peers. no safety concerns at this time.  P:   Pt. Will develop and continue to utilize positive coping skills.

## 2021-04-08 MED ORDER — FUROSEMIDE 20 MG TAB
20 mg | Freq: Once | ORAL | Status: AC
Start: 2021-04-08 — End: 2021-04-08
  Administered 2021-04-08: 19:00:00 via ORAL

## 2021-04-08 MED FILL — PANTOPRAZOLE 40 MG TAB, DELAYED RELEASE: 40 mg | ORAL | Qty: 1

## 2021-04-08 MED FILL — CLONIDINE 0.1 MG TAB: 0.1 mg | ORAL | Qty: 1

## 2021-04-08 MED FILL — ARIPIPRAZOLE 10 MG TAB: 10 mg | ORAL | Qty: 1

## 2021-04-08 MED FILL — HYDROXYZINE 50 MG TAB: 50 mg | ORAL | Qty: 1

## 2021-04-08 MED FILL — TRAZODONE 50 MG TAB: 50 mg | ORAL | Qty: 1

## 2021-04-08 MED FILL — NICOTINE 14 MG/24 HR DAILY PATCH: 14 mg/24 hr | TRANSDERMAL | Qty: 1

## 2021-04-08 MED FILL — FUROSEMIDE 20 MG TAB: 20 mg | ORAL | Qty: 1

## 2021-04-08 MED FILL — SERTRALINE 50 MG TAB: 50 mg | ORAL | Qty: 1

## 2021-04-08 NOTE — Progress Notes (Signed)
Patient is making progress in treatment.

## 2021-04-08 NOTE — Progress Notes (Signed)
Was asked to evalaute patient has she is complaining of lower extremity pain. She has trouble to exactly describe what is going on. Mild swelling noted and she does say if she is on her feet for extended periods that her legs swell and start hurting. Will give a single dose of lasix 20mg  and have nursing apply compression stockings and use while ambulating and allow patient to keep leg raised when sitting in chair.

## 2021-04-08 NOTE — Progress Notes (Signed)
Discussed case interviewed patient  Taking and tolerating medications well  Remains depressed and anxious  She is concerned about safety outside the unit  Taking and tolerating medications well  Clonidine was added to help with nightmares and sleep and flashbacks  Attends groups interacts with peers and staff  Remains depressed and anxious at times    Mental status exam  Alert oriented fair eye contact  Speech is goal-directed soft tone  Mood is depressed affect is restricted  Thought process linear and logical  Insight and judgment fair  Does not appear to respond to internal stimuli    Recommendations  Continue inpatient treatments  Follow-up with psychiatric team again tomorrow

## 2021-04-08 NOTE — Behavioral Health Treatment Team (Signed)
B: Patient is alert and oriented x 3. Patient constantly at desk asking questions. Patient praised for questioning medications with this nurse. Patient has been started on Clonidine 0.1mg  PO BID for nightmares. Patient asked for medication to help her sleep. Informed this Clinical research associate that Trazadone is not a medication that she can take. Patient agreed to take Atarax to help with sleep. Patient has been up interacting with peers and staff. Smile and is not withdrawn as she was when first admitted. Patient interacts with both males and females. When patient first admitted did not want to be around any men and was extremely paranoid. Patient has shown improvement. Praised patient for the way she is socializing with peers and staff. Patient states that she was told she was being discharged on Thursday.Will continue to monitor every 15 minutes for safety.  I: Provide medications as ordered. Paln for discharge on Thursday. [place] to monitor every 15 minutes for safety.  R: Patient has been compliant with medications. Working on discharge placement at this time. Continue to monitor every 15 minutes for safety.  P: Plan for discharge on Thursday.      1515 Patient has been asleep all night as evidenced by every 15 minute safety checks. Will continue to monitor for safety every 15 minutes.

## 2021-04-08 NOTE — Progress Notes (Signed)
Patietn is progressing toward goals.

## 2021-04-08 NOTE — Behavioral Health Treatment Team (Signed)
 B:  Pt. Alert and oriented x 3.  Pt. States depression is a 4.  Pt. States anxiety is 2.  Pt. denies hallucinations.  Denies SI/HI.  No complaints with assessment.  PERRLA.  Affect blunted.  Thyroid midline.  No acute distress noted.  Lungs clear to auscultation.  No cough.  Heart RRR, no murmur.  Abdomen softly distended, nontender; bs + all quads.  Denies constipation.  Per pt, voids yellow urine without pain or difficulty.  Peripheral pulses 2+ and equal bilaterally.  Ambulatory with steady gait.     I:   Administer medications as ordered and needed, Encourage pt to attend and participate in groups, encourage pt to be up for all meals and snacks, consuming all each time, encourage pt to interact with peers in a positive manner. Q 15 minute safety checks continue.  R:   Compliant with medications.  does attend groups, does participate.  Pt. is getting up for meals, consumes 100% of meals, snacks. Pt. does interact with peers. no safety concerns at this time.  P:   Pt. Will develop and continue to utilize positive coping skills. Pt will be patient with regard to placement.  Per notes in chart, no placement found at this time.  Will continue to monitor for safety q15 min per unit protocol.
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
## 2021-04-08 NOTE — Progress Notes (Signed)
Patient requested to talk while rounding in the Behavioral Health  Unit. She shared that she wasn't doing well because she is due to be discharged tomorrow, but she still doesn't have a place to stay and doesn't want to have to remain in here because of that. Patient was tearful at moments as she felt very unsure of her future. She began to empower herself by quoting scriptures and saying affirmation that helped to lift her spirits. Chaplain provided emotional and spiritual support, words of encouragement, and prayer. Advised of availability.  Rev. LaToya Swaziland, MDiv, Chaplain

## 2021-04-09 MED FILL — CLONIDINE 0.1 MG TAB: 0.1 mg | ORAL | Qty: 1

## 2021-04-09 MED FILL — ARIPIPRAZOLE 10 MG TAB: 10 mg | ORAL | Qty: 1

## 2021-04-09 MED FILL — HYDROXYZINE 50 MG TAB: 50 mg | ORAL | Qty: 1

## 2021-04-09 MED FILL — PANTOPRAZOLE 40 MG TAB, DELAYED RELEASE: 40 mg | ORAL | Qty: 1

## 2021-04-09 MED FILL — NICOTINE 14 MG/24 HR DAILY PATCH: 14 mg/24 hr | TRANSDERMAL | Qty: 1

## 2021-04-09 MED FILL — SERTRALINE 50 MG TAB: 50 mg | ORAL | Qty: 1

## 2021-04-09 NOTE — Behavioral Health Treatment Team (Signed)
 Behavioral Health Treatment Team Note     Patient goal(s) for today: Pt reports keep staying focused, do what I need to do  Treatment team focus/goals: medication management, safe discharge planning, attend groups daily    Progress note: Pt was observed seated in her bed. Pt presented a flat affect but stated she was eager to speak with this Clinical research associate. Pt was oriented x4 and denies any current SI, HI, AVH. Pt stated that she has decided that she wants to return to [place] stating My whole life is there. Writer processed with pt regarding a safe discharge plan. Pt stated that she cannot stay with her sister, Samule but stated that she has places to go in [place] and that she wants to be close to her case manager who is working on housing. Writer validated pt's feelings but reiterated the process of ensuring a safe discharge plan. Pt stated that she understood this and would be patient. Pt stated that she would like to go to the court house in [place] to check on the protective order. An inaptient level of care is needed in order to coordinate a safe discharge plan and to stabilize the pt further on medication.     LOS:  8  Expected LOS: 9-10 days    Insurance info/prescription coverage:  [facility] [place]  [facility]  Date of last family contact:  Writer attempted to contact friend, Samule (sister/friend) but was unable to leave a message. Writer left a message with Roxanne Razor, pt's recent crisis case manager to discuss return of belongings.  Family requesting physician contact today:  no  Discharge plan:  Recommended group home  Guns in the home:  no   Outpatient provider(s):  [facility]    Participating treatment team members: Annabella LITTIE Kitty, * (assigned SW), Tinnie Plume, LPC
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
## 2021-04-09 NOTE — Behavioral Health Treatment Team (Signed)
B: Patient is alert and oriented x 3. Patient is very excited that she is taking a medicaid cab to [place] to [employer] and will pick up her belongings that are locked up for her there and will pick up her medications from [facility] in [place]. Patient will then make her own arrangements with the Medicaid cab to take her to her sister's home in [place]. Patient states that she is ready for discharge and is happy that she is going to be staying with her sister. Will continue to monitor every 15 minutes for safety.  I: Provide medications as ordered. Provide discharge teaching to include discharge medications and follow up appointments. Continue to monitor every 15 minutes for safety.   R: Patient has been compliant with medications and feels that she has met her goals and is ready for discharge.  P: Prepare for discharge in the am. Continue to monitor every 15 minutes for safety.      0335: Patient slept well until until shortly after 245am then patient got up walked down the hall and went back to bed. Patient stated she is excited to be going home this am.

## 2021-04-09 NOTE — Behavioral Health Treatment Team (Signed)
 B:  Pt. Alert and oriented x 3.  Pt. States depression is a 2.  Pt. States anxiety is 4.  Pt. denies hallucinations.  Denies SI/HI.  No complaints with assessment.  Pt is coming to nurses' station repeatedly asking to meet with the social worker and stating that she is going home today.  PERRLA.  Speech clear.  Thyroid midline.  Lungs clear.  Heart RRR, no murmur.  Abdomen softly distended, nontender; bs + all quads.  Denies constipation.  Per pt, voids yellow urine without pain or difficulty.  Peripheral pulses 2+ and equal bilaterally. Ambulatory with a steady gait.    I:   Administer medications as ordered and needed, Encourage pt to attend and participate in groups, encourage pt to be up for all meals and snacks, consuming all each time, encourage pt to interact with peers in a positive manner. Q 15 minute safety checks continue.  R:   Compliant with medications.  does attend groups, does participate.  Pt. is getting up for meals, consumes 100% of meals, snacks. Pt. does interact with peers. no safety concerns at this time.  P:   Pt. Will develop and continue to utilize positive coping skills. Will attend and participate in group sessions and discharge planning.  Will continue to monitor for safety q15 minutes per unit protocol.

## 2021-04-09 NOTE — Progress Notes (Signed)
Visited with patient while rounding in the Behavioral Health Unit. Patient shared she had a praise report, and shared she checked her account and there was money in there. She was very relieved. She disclosed that she would like to stop smoking but knows it will be difficult to do once she leaves here. Chaplain provided emotional and spiritual support, words of encouragement, and prayer. Advised of availability.  Rev. LaToya Swaziland, MDiv, Chaplain

## 2021-04-09 NOTE — Behavioral Health Treatment Team (Signed)
Collateral: Medicaid cab scheduled for 11:00 am. Confirmation number [id number]. [employer] transportation: [phone]

## 2021-04-09 NOTE — Behavioral Health Treatment Team (Signed)
Writer attempted to contact Sabrina, pt's sister. [phone]. Writer was unable to leave a message. Pt had signed a release for sister to see if staying with her temporarily is an option or if she could meet pt upon return to [place].    Writer called and left a message with case manager, Arnette Felts, from pt's recent crisis program to coordinate bringing pt her belongings. Writer and Ms. Stokes were supposed to coordinate Tuesday for pt's anticipated discharge but writer was unable to reach Ms. Tristan Schroeder. An APS report will be made if unable to coordinate pt's belongings.

## 2021-04-10 MED ORDER — SERTRALINE 50 MG TAB
50 mg | ORAL_TABLET | Freq: Every day | ORAL | 1 refills | Status: AC
Start: 2021-04-10 — End: 2021-05-10

## 2021-04-10 MED ORDER — PANTOPRAZOLE 40 MG TAB, DELAYED RELEASE
40 mg | ORAL_TABLET | Freq: Every day | ORAL | 1 refills | Status: AC
Start: 2021-04-10 — End: 2021-05-11

## 2021-04-10 MED ORDER — ARIPIPRAZOLE 10 MG TAB
10 mg | ORAL_TABLET | Freq: Every day | ORAL | 1 refills | Status: AC
Start: 2021-04-10 — End: 2021-05-11

## 2021-04-10 MED FILL — SERTRALINE 50 MG TAB: 50 mg | ORAL | Qty: 1

## 2021-04-10 MED FILL — CLONIDINE 0.1 MG TAB: 0.1 mg | ORAL | Qty: 1

## 2021-04-10 MED FILL — ARIPIPRAZOLE 10 MG TAB: 10 mg | ORAL | Qty: 1

## 2021-04-10 MED FILL — NICOTINE 14 MG/24 HR DAILY PATCH: 14 mg/24 hr | TRANSDERMAL | Qty: 1

## 2021-04-10 MED FILL — PANTOPRAZOLE 40 MG TAB, DELAYED RELEASE: 40 mg | ORAL | Qty: 1

## 2021-04-10 NOTE — Behavioral Health Treatment Team (Signed)
DISCHARGE SUMMARY    NAME:Cynthia Soto  DOB: [birth year]  MRN: [id number]    The patient Cynthia Soto exhibits the ability to control behavior in a less restrictive environment.  Patient's level of functioning is improving.  No assaultive/destructive behavior has been observed for the past 24 hours.  No suicidal/homicidal threat or behavior has been observed for the past 24 hours.  There is no evidence of serious medication side effects.  Patient has not been in physical or protective restraints for at least the past 24 hours.    If weapons involved, how are they secured? N/A    Is patient aware of and in agreement with discharge plan? Yes    Arrangements for medication:  Prescriptions given to patient, given a weeks supply or 30 day supply.     Copy of discharge instructions to provider?:  Yes    Arrangements for transportation home:  Patient will be picked up by Medicaid cab and taken to [facility] to retrieve her belongings and will take a cab to [place] to resume case management services.    Keep all follow up appointments as scheduled, continue to take prescribed medications per physician instructions.  Mental health crisis number:  911 or your local mental health crisis line number at [phone]      Mental Health Emergency WARM LINE      1-866-400-MHAV 9473808810)      M-F: 9am to 9pm      Sat & Sun: 5pm - 9pm  National suicide prevention lines:                             1-800-SUICIDE 718-388-2894)       1-800-273-TALK (510)772-5133)   24/7 Crisis Text Line:  Text HOME to 709-180-3418

## 2021-04-10 NOTE — Behavioral Health Treatment Team (Signed)
Dr. Azucena Fallen with pt, orders to discharge today.

## 2021-04-10 NOTE — Behavioral Health Treatment Team (Signed)
Pt. Was discharged home via taxi in stable cond. Pt. Voiced no thoughts of wanting to harm self or others.

## 2021-04-10 NOTE — Behavioral Health Treatment Team (Signed)
Pt. Belongings given back to pt., verified all there, signed for. Discharge instructions explained to pt. Including, Follow up appt. With [facility] .  Medications called into [facility] in [place], [place].  Pt. Denies SI/HI at time of discharge. Pt. is a Smoker. Smoking cessation education wasprovided.  Pt. is not a drinker.  Alcohol Education was not Provided.  Discharge Paperwork and H & P, faxed to [facility]., With success sheet. Pt. was not discharged on 2 or more Antipsychotics.  Pt. Displayed no safety concerns at discharge.  Pt. Escorted to front by staff for transportation by Medicaid cab, to [place] hotel to get her belongings, then to [place], [place].

## 2021-04-10 NOTE — Behavioral Health Treatment Team (Signed)
B:  Pt. Alert and oriented x 4.  Pt. States depression is a 0.  Pt. States anxiety is 0.  Pt. Denies hallucinations.  Denies SI/HI.  Cooperative with assessment.  Pt. Smiling, happy, states she is ready to go home, she feels she can handle it.   I:   Administer medications as ordered and needed, Encourage pt to attend and participate in groups, encourage pt to be up for all meals and snacks, consuming all each time, encourage pt to interact with peers in a positive manner. Q 15 minute safety checks continue.  R:   Compliant with medications.  does attend groups, does participate.  Pt. is getting up for meals, consumes all of meals, snacks. Pt. does interact with peers. no safety concerns at this time.  P:   Pt. Will develop and continue to utilize positive coping skills.

## 2021-04-10 NOTE — Behavioral Health Treatment Team (Signed)
Writer called and spoke with [employer] regarding pt's medicaid cab.  After verifying with driver, Driver is expected to be here in approx. 10 min., pt. Aware, they will call unit when they arrive.

## 2021-04-10 NOTE — Discharge Summary (Signed)
Discharge summary    Hospital course  45 year old female admitted to psychiatric unit May 25 and discharged April 10, 2021.  She presented to the hospital with hearing voices depressed mood confused paranoid ideations and psychotic outlook.  She has history of schizoaffective disorder and was recently hospitalized and discharged from [facility].  She reported that she was hearing voices telling her to do things and was unable to pledge for safety.  She benefited from inpatient treatments including psychosocial interventions and psychiatric management.  We discussed her progress in our multidisciplinary treatment team.  Initially she was significantly psychotic and depressed and had difficulties thinking clearly.  With treatment she was doing better attending groups and interacting with peers and staff and was able to maintain safe behaviors on the unit and was making more sense in her conversations less confused.  As time went on she denies hearing voices and decreased paranoid ideations and was thinking more clearly.  She interacted with peers and staff worked on her treatment and discharge safety plan.  Her mood was improving and she was eating and sleeping better and overall functioning was doing better and she worked on her discharge.  With these improvements in place we decided that the goals of psychiatric inpatient stay was met June 3 and that she could continue her treatment in the outpatient setting thus she was discharged that day.  At discharge she denied suicidal ideations denied hearing voices and pledged for safety    Discharge mental status exam  Alert oriented cooperative  Speech is goal-directed normal tone  Mood is good affect is improved  Thought process linear and logical  Thought content no suicidal ideations no homicidal ideations  Does not appear to respond to internal stimuli  Insight and judgment fair to good    Discharge diagnoses  Schizoaffective disorder    Discharge  recommendations  Continue current medication regimen, prescriptions provided  Referrals and appointments were made to the outpatient setting  She pledges for safety outside the hospital and denied suicidal ideations    Discharge condition  Good    Prognosis  Good    We wish her well

## 2021-04-10 NOTE — Behavioral Health Treatment Team (Signed)
 Behavioral Health Transition Record to Provider    Patient Name: Cynthia Soto  Date of Birth: [birth year]  Medical Record Number: [id number]  Date of Admission: 04/01/2021  Date of Discharge: 04/10/2021    Attending Provider: Rigoberto Elpidio POUR, MD  Discharging Provider: Dr. Rigoberto  To contact this individual call [phone] and ask the operator to page.  If unavailable, ask to be transferred to [facility] Provider on call.  A Behavioral Health Provider will be available on call 24/7 and during holidays.    Primary Care Provider: None    Allergies   Allergen Reactions   . Haldol [Haloperidol Lactate] Hives   . Penicillins Unknown (comments)       Reason for Admission: Pt was admitted due to symptoms related to schizoaffective disorder. Pt presented with AH.    Admission Diagnosis: Self-referral [Z02.89]  Schizoaffective disorder (HCC) [F25.9]  Bipolar 1 disorder (HCC) [F31.9]    * No surgery found *    Results for orders placed or performed during the hospital encounter of 03/31/21   COVID-19 WITH INFLUENZA A/B   Result Value Ref Range    SARS-CoV-2 by PCR Not Detected Not Detected      Influenza A by PCR Not Detected Not Detected      Influenza B by PCR Not Detected Not Detected     CULTURE, URINE    Specimen: Urine   Result Value Ref Range    Special Requests: No Special Requests  Reflexed from [id number]        Colony Count >100,000  colonies/ml        Culture result: Mixed urogenital flora isolated     CBC WITH AUTOMATED DIFF   Result Value Ref Range    WBC 5.4 3.6 - 11.0 K/uL    RBC 4.50 3.80 - 5.20 M/uL    HGB 11.9 11.5 - 16.0 g/dL    HCT 61.1 64.9 - 52.9 %    MCV 86.2 80.0 - 99.0 FL    MCH 26.4 26.0 - 34.0 PG    MCHC 30.7 30.0 - 36.5 g/dL    RDW 77.6 (H) 88.4 - 14.5 %    PLATELET 174 150 - 400 K/uL    NRBC 0.0 0.0 PER 100 WBC    ABSOLUTE NRBC 0.00 0.00 - 0.01 K/uL    NEUTROPHILS 69 32 - 75 %    LYMPHOCYTES 25 12 - 49 %    MONOCYTES 6 5 - 13 %    EOSINOPHILS 0 0 - 7 %    BASOPHILS 0 0 - 1 %    IMMATURE  GRANULOCYTES 0 0 - 0.5 %    ABS. NEUTROPHILS 3.6 1.8 - 8.0 K/UL    ABS. LYMPHOCYTES 1.4 0.8 - 3.5 K/UL    ABS. MONOCYTES 0.3 0.0 - 1.0 K/UL    ABS. EOSINOPHILS 0.0 0.0 - 0.4 K/UL    ABS. BASOPHILS 0.0 0.0 - 0.1 K/UL    ABS. IMM. GRANS. 0.0 0.00 - 0.04 K/UL    DF AUTOMATED     DRUG SCREEN, URINE   Result Value Ref Range    AMPHETAMINES Negative Negative      BARBITURATES Negative Negative      BENZODIAZEPINES Negative Negative      COCAINE Negative Negative      METHADONE Negative Negative      OPIATES Negative Negative      PCP(PHENCYCLIDINE) Negative Negative      THC (TH-CANNABINOL) Negative Negative  Drug screen comment        This test is a screen for drugs of abuse in a medical setting only (i.e., they are unconfirmed results and as such must not be used for non-medical purposes, e.g.,employment testing, legal testing). Due to its inherent nature, false positive (FP) and false negative (FN) results may be obtained. Therefore, if necessary for medical care, recommend confirmation of positive findings by GC/MS.   URINALYSIS W/ REFLEX CULTURE    Specimen: Urine   Result Value Ref Range    Color Yellow/Straw      Appearance Turbid (A) Clear      Specific gravity <1.005 1.003 - 1.030    pH (UA) 6.0 5.0 - 8.0      Protein Negative Negative mg/dL    Glucose Negative Negative mg/dL    Ketone Negative Negative mg/dL    Bilirubin Negative Negative      Blood Negative Negative      Urobilinogen 0.1 0.1 - 1.0 EU/dL    Nitrites Negative Negative      Leukocyte Esterase Large (A) Negative      UA:UC IF INDICATED Urine Culture Ordered (A) Culture not indicated by UA result      WBC 10-20 0 - 4 /hpf    RBC 0-5 0 - 5 /hpf    Bacteria 1+ (A) Negative /hpf    Mucus Trace /lpf   ACETAMINOPHEN   Result Value Ref Range    Acetaminophen level <10 (L) 10 - 30 ug/mL   SALICYLATE   Result Value Ref Range    Salicylate level 2.4 (L) 2.8 - 20.0 mg/dL   ETHYL ALCOHOL   Result Value Ref Range    ALCOHOL(ETHYL),SERUM <10 <10 mg/dL    METABOLIC PANEL, COMPREHENSIVE   Result Value Ref Range    Sodium 138 136 - 145 mmol/L    Potassium 3.4 (L) 3.5 - 5.1 mmol/L    Chloride 110 (H) 97 - 108 mmol/L    CO2 23 21 - 32 mmol/L    Anion gap 5 5 - 15 mmol/L    Glucose 84 65 - 100 mg/dL    BUN 8 6 - 20 mg/dL    Creatinine 9.43 9.44 - 1.02 mg/dL    BUN/Creatinine ratio 14 12 - 20      GFR est AA >60 >60 ml/min/1.9m2    GFR est non-AA >60 >60 ml/min/1.62m2    Calcium 9.3 8.5 - 10.1 mg/dL    Bilirubin, total 0.4 0.2 - 1.0 mg/dL    AST (SGOT) 13 (L) 15 - 37 U/L    ALT (SGPT) 24 12 - 78 U/L    Alk. phosphatase 59 45 - 117 U/L    Protein, total 6.0 (L) 6.4 - 8.2 g/dL    Albumin 3.0 (L) 3.5 - 5.0 g/dL    Globulin 3.0 2.0 - 4.0 g/dL    A-G Ratio 1.0 (L) 1.1 - 2.2     HCG URINE, QL   Result Value Ref Range    HCG urine, QL Negative Negative     EKG, 12 LEAD, INITIAL   Result Value Ref Range    Ventricular Rate 76 BPM    Atrial Rate 76 BPM    P-R Interval 132 ms    QRS Duration 88 ms    Q-T Interval 424 ms    QTC Calculation (Bezet) 477 ms    Calculated P Axis 22 degrees    Calculated R Axis 2 degrees    Calculated  T Axis 16 degrees    Diagnosis       Normal sinus rhythm  Normal ECG  No previous ECGs available  Confirmed by Vicky, M.D., Wanda ([id number]) on 03/31/2021 10:47:08 AM         Immunizations administered during this encounter:   There is no immunization history on file for this patient.    Screening for Metabolic Disorders for Patients on Antipsychotic Medications  (Data obtained from the EMR)    Estimated Body Mass Index  Estimated body mass index is 29.05 kg/m as calculated from the following:    Height as of 03/31/21: 5' 6 (1.676 m).    Weight as of 03/31/21: 81.6 kg (180 lb).     Vital Signs/Blood Pressure  Visit Vitals  BP 120/73   Pulse 64   Temp 97.3 F (36.3 C)   Resp 18   SpO2 99%   Breastfeeding No       Blood Glucose/Hemoglobin A1c  Lab Results   Component Value Date/Time    Glucose 84 04/01/2021 01:07 AM       No results found for: HBA1C,  HBA1CEXT     Lipid Panel  No results found for: CHOL, CHOLX, CHLST, CHOLV, 884269, HDL, HDLP, LDL, LDLC, DLDLP, TGLX, TRIGL, TRIGP, CHHD, CHHDX     Discharge Diagnosis: Schizoaffective Disorder    Discharge Plan: It was recommended that pt go to a group home or residential crisis stabilization. Pt refused this suggestion and will be returning to [facility] to resume case management services.    Discharge Medication List and Instructions:   Current Discharge Medication List          Unresulted Labs (24h ago, onward)            None        To obtain results of studies pending at discharge, please contact [phone]    Follow-up Information     Follow up With Specialties Details Why Contact Info    [facility]  Call today  Rexene Drafts, Case manager  [phone]    Fax hospital discharge to [phone]          Advanced Directive:   Does the patient have an appointed surrogate decision maker? No  Does the patient have a Medical Advance Directive? No  Does the patient have a Psychiatric Advance Directive? No  If the patient does not have a surrogate or Medical Advance Directive AND Psychiatric Advance Directive, the patient was offered information on these advance directives Patient will complete at a later time    Patient Instructions: Please continue all medications until otherwise directed by physician.      Tobacco Cessation Discharge Plan:   Is the patient a smoker and needs referral for smoking cessation? Not applicable  Patient referred to the following for smoking cessation with an appointment? Not applicable     Patient was offered medication to assist with smoking cessation at discharge? Not applicable  Was education for smoking cessation added to the discharge instructions? Not applicable    Alcohol/Substance Abuse Discharge Plan:   Does the patient have a history of substance/alcohol abuse and requires a referral for treatment? Not applicable  Patient referred to the following for substance/alcohol  abuse treatment with an appointment? Not applicable  Patient was offered medication to assist with alcohol cessation at discharge? Not applicable  Was education for substance/alcohol abuse added to discharge instructions? Not applicable    Patient discharged to Home; discussed with patient/caregiver Pt reports  that she wants to return to [facility] to stay in a hotel so she can be closer to case manager to resume looking for housing.
